# Patient Record
Sex: Female | Born: 1970 | Race: White | Hispanic: No | Smoking: Current every day smoker
Health system: Southern US, Community
[De-identification: ages and names within clinical notes are randomized; demographics above are authoritative.]

## PROBLEM LIST (undated history)

## (undated) DIAGNOSIS — E785 Hyperlipidemia, unspecified: Secondary | ICD-10-CM

## (undated) DIAGNOSIS — J439 Emphysema, unspecified: Secondary | ICD-10-CM

## (undated) DIAGNOSIS — K219 Gastro-esophageal reflux disease without esophagitis: Secondary | ICD-10-CM

## (undated) DIAGNOSIS — F32A Depression, unspecified: Secondary | ICD-10-CM

## (undated) DIAGNOSIS — F419 Anxiety disorder, unspecified: Secondary | ICD-10-CM

## (undated) HISTORY — PX: ABDOMINAL HYSTERECTOMY: SHX81

## (undated) HISTORY — DX: Depression, unspecified: F32.A

## (undated) HISTORY — DX: Emphysema, unspecified: J43.9

## (undated) HISTORY — PX: OTHER SURGICAL HISTORY: SHX169

## (undated) HISTORY — DX: Hyperlipidemia, unspecified: E78.5

## (undated) HISTORY — DX: Gastro-esophageal reflux disease without esophagitis: K21.9

## (undated) HISTORY — PX: VAGINAL HYSTERECTOMY: SUR661

---

## 2003-03-19 ENCOUNTER — Ambulatory Visit (HOSPITAL_COMMUNITY): Admission: RE | Admit: 2003-03-19 | Discharge: 2003-03-19 | Payer: Self-pay | Admitting: Family Medicine

## 2003-03-19 ENCOUNTER — Encounter: Payer: Self-pay | Admitting: Family Medicine

## 2003-06-04 ENCOUNTER — Encounter: Payer: Self-pay | Admitting: Family Medicine

## 2003-06-04 ENCOUNTER — Ambulatory Visit (HOSPITAL_COMMUNITY): Admission: RE | Admit: 2003-06-04 | Discharge: 2003-06-04 | Payer: Self-pay | Admitting: Family Medicine

## 2006-12-12 ENCOUNTER — Encounter (INDEPENDENT_AMBULATORY_CARE_PROVIDER_SITE_OTHER): Payer: Self-pay | Admitting: *Deleted

## 2006-12-12 ENCOUNTER — Observation Stay (HOSPITAL_COMMUNITY): Admission: RE | Admit: 2006-12-12 | Discharge: 2006-12-13 | Payer: Self-pay | Admitting: Obstetrics & Gynecology

## 2008-09-29 ENCOUNTER — Emergency Department (HOSPITAL_COMMUNITY): Admission: EM | Admit: 2008-09-29 | Discharge: 2008-09-30 | Payer: Self-pay | Admitting: Emergency Medicine

## 2010-01-21 ENCOUNTER — Emergency Department (HOSPITAL_COMMUNITY): Admission: EM | Admit: 2010-01-21 | Discharge: 2010-01-21 | Payer: Self-pay | Admitting: Emergency Medicine

## 2010-01-31 ENCOUNTER — Emergency Department (HOSPITAL_COMMUNITY): Admission: EM | Admit: 2010-01-31 | Discharge: 2010-01-31 | Payer: Self-pay | Admitting: Emergency Medicine

## 2010-02-15 ENCOUNTER — Emergency Department (HOSPITAL_COMMUNITY): Admission: EM | Admit: 2010-02-15 | Discharge: 2010-02-15 | Payer: Self-pay | Admitting: Emergency Medicine

## 2010-03-22 ENCOUNTER — Emergency Department (HOSPITAL_COMMUNITY): Admission: EM | Admit: 2010-03-22 | Discharge: 2010-03-22 | Payer: Self-pay | Admitting: Emergency Medicine

## 2010-11-12 LAB — URINE MICROSCOPIC-ADD ON

## 2010-11-12 LAB — CBC
Platelets: 217 10*3/uL (ref 150–400)
RBC: 4.94 MIL/uL (ref 3.87–5.11)
WBC: 19.3 10*3/uL — ABNORMAL HIGH (ref 4.0–10.5)

## 2010-11-12 LAB — URINALYSIS, ROUTINE W REFLEX MICROSCOPIC
Glucose, UA: NEGATIVE mg/dL
Ketones, ur: NEGATIVE mg/dL
Leukocytes, UA: NEGATIVE
Nitrite: NEGATIVE
Protein, ur: 30 mg/dL — AB
Specific Gravity, Urine: >1.03 — ABNORMAL HIGH (ref 1.005–1.030)
Urobilinogen, UA: 0.2 mg/dL (ref 0.0–1.0)
pH: 5.5 (ref 5.0–8.0)

## 2010-11-12 LAB — URINE CULTURE
Colony Count: NO GROWTH
Culture: NO GROWTH

## 2010-11-12 LAB — BASIC METABOLIC PANEL
Calcium: 9.3 mg/dL (ref 8.4–10.5)
Creatinine, Ser: 1.01 mg/dL (ref 0.4–1.2)
GFR calc Af Amer: >60 mL/min (ref >60)
GFR calc non Af Amer: >60 mL/min (ref >60)

## 2010-11-12 LAB — DIFFERENTIAL
Basophils Absolute: 0 10*3/uL (ref 0.0–0.1)
Lymphocytes Relative: 11 % — ABNORMAL LOW (ref 12–46)
Lymphs Abs: 2.2 10*3/uL (ref 0.7–4.0)
Neutrophils Relative %: 85 % — ABNORMAL HIGH (ref 43–77)

## 2010-11-12 LAB — POCT PREGNANCY, URINE: Preg Test, Ur: NEGATIVE

## 2010-11-13 LAB — URINE MICROSCOPIC-ADD ON

## 2010-11-13 LAB — URINALYSIS, ROUTINE W REFLEX MICROSCOPIC
Ketones, ur: NEGATIVE mg/dL
Leukocytes, UA: NEGATIVE
Nitrite: NEGATIVE
Specific Gravity, Urine: >1.03 — ABNORMAL HIGH (ref 1.005–1.030)
Urobilinogen, UA: 1 mg/dL (ref 0.0–1.0)
pH: 6 (ref 5.0–8.0)

## 2010-11-13 LAB — CBC
HCT: 46.7 % — ABNORMAL HIGH (ref 36.0–46.0)
Hemoglobin: 15.7 g/dL — ABNORMAL HIGH (ref 12.0–15.0)
MCHC: 33.6 g/dL (ref 30.0–36.0)
MCV: 94.7 fL (ref 78.0–100.0)
Platelets: 214 10*3/uL (ref 150–400)
RDW: 13.3 % (ref 11.5–15.5)

## 2010-11-13 LAB — BASIC METABOLIC PANEL
BUN: 6 mg/dL (ref 6–23)
CO2: 24 mEq/L (ref 19–32)
GFR calc non Af Amer: >60 mL/min (ref >60)
Glucose, Bld: 98 mg/dL (ref 70–99)
Potassium: 4 mEq/L (ref 3.5–5.1)

## 2010-11-13 LAB — DIFFERENTIAL
Basophils Absolute: 0 10*3/uL (ref 0.0–0.1)
Basophils Relative: 0 % (ref 0–1)
Eosinophils Absolute: 0.2 10*3/uL (ref 0.0–0.7)
Eosinophils Relative: 2 % (ref 0–5)
Lymphocytes Relative: 29 % (ref 12–46)
Monocytes Absolute: 0.5 10*3/uL (ref 0.1–1.0)

## 2010-11-14 LAB — POCT PREGNANCY, URINE: Preg Test, Ur: NEGATIVE

## 2010-11-14 LAB — URINE MICROSCOPIC-ADD ON

## 2010-11-14 LAB — PREGNANCY, URINE: Preg Test, Ur: NEGATIVE

## 2010-11-14 LAB — URINALYSIS, ROUTINE W REFLEX MICROSCOPIC
Bilirubin Urine: NEGATIVE
Glucose, UA: NEGATIVE mg/dL
Ketones, ur: NEGATIVE mg/dL
Leukocytes, UA: NEGATIVE
Nitrite: NEGATIVE
Nitrite: NEGATIVE
Protein, ur: NEGATIVE mg/dL
Urobilinogen, UA: 1 mg/dL (ref 0.0–1.0)
pH: 6.5 (ref 5.0–8.0)

## 2010-12-06 ENCOUNTER — Emergency Department (HOSPITAL_COMMUNITY)
Admission: EM | Admit: 2010-12-06 | Discharge: 2010-12-06 | Disposition: A | Discharge status: Home / Self Care | Payer: Medicaid Other | Attending: Emergency Medicine | Admitting: Emergency Medicine

## 2010-12-06 ENCOUNTER — Emergency Department (HOSPITAL_COMMUNITY): Payer: Medicaid Other

## 2010-12-06 DIAGNOSIS — Y92009 Unspecified place in unspecified non-institutional (private) residence as the place of occurrence of the external cause: Secondary | ICD-10-CM | POA: Insufficient documentation

## 2010-12-06 DIAGNOSIS — Y998 Other external cause status: Secondary | ICD-10-CM | POA: Insufficient documentation

## 2010-12-06 DIAGNOSIS — F17200 Nicotine dependence, unspecified, uncomplicated: Secondary | ICD-10-CM | POA: Insufficient documentation

## 2010-12-06 DIAGNOSIS — S8000XA Contusion of unspecified knee, initial encounter: Secondary | ICD-10-CM | POA: Insufficient documentation

## 2010-12-06 DIAGNOSIS — W010XXA Fall on same level from slipping, tripping and stumbling without subsequent striking against object, initial encounter: Secondary | ICD-10-CM | POA: Insufficient documentation

## 2011-01-13 NOTE — Op Note (Signed)
Lacey Woodard, Lacey Woodard              ACCOUNT NO.:  [id number]   MEDICAL RECORD NO.:  [id number]          PATIENT TYPE:  INP   LOCATION:  A428                          FACILITY:  [facility]   PHYSICIAN:  Lazaro Arms, M.D.   DATE OF BIRTH:  Jan 11, 1971   DATE OF PROCEDURE:  12/12/2006  DATE OF DISCHARGE:                               OPERATIVE REPORT   PREOPERATIVE DIAGNOSES:  1. Menometrorrhagia.  2. Dysmenorrhea.  3. Dyspareunia.   POSTOPERATIVE DIAGNOSES:  1. Menometrorrhagia.  2. Dysmenorrhea.  3. Dyspareunia.  4. Suspected adenomyosis.   PROCEDURE:  Total vaginal hysterectomy, bilateral salpingo-oophorectomy.   SURGEON:  Lazaro Arms, M.D.   ANESTHESIA:  General endotracheal.   FINDINGS:  The patient had a soft, mushy uterus consistent with  adenomyosis.  The ovaries and tubes were otherwise normal.  She may have  had a small endometrioma on the right ovary but I was unsure.  It would  have been very small.  It could have just been old hemorrhagic corpus  luteum but they will evaluate it at pathology.   DESCRIPTION OF OPERATION:  The patient taken to the operating room,  placed in supine position where she underwent general endotracheal  anesthesia.  Placed in the dorsal lithotomy position.  Prepped and  draped in the usual sterile fashion.  Weighted speculum was placed.  Foley catheter was placed.  Cervix was grasped.  Circumferential  incision was made with the electrocautery unit.  The anterior and  posterior vagina pushed off the lower uterine segment.  The anterior cul-  de-sac was entered.  The uterosacral ligaments were clamped, cut and  suture ligated bilaterally.  Cardinal ligaments were clamped, cut and  suture ligated.  Anterior cul-de-sac was entered.  The anterior and  posterior leaves of the broad ligament were plicated.  Uterine vessels  were clamped, cut and suture ligated.  A pedicle was taken up the fundus  on each side, clamped, cut and suture ligated.   The utero-ovarian  ligaments were crossclamped and double suture ligated with good  hemostasis.  The ovaries were then grasp.  The infundibular pelvic  ligaments were clamped, cut and suture ligated bilaterally with good  hemostasis.  A;; the pedicles were found to be hemostatic.  The perineum  was closed with a pursestring suture and 3-0 Monocryl.  The  vagina was closed side-to-side interrupted using 0 Vicryl.  The patient  tolerated the procedure well.  She experienced 100 mL blood loss,  tolerated procedure well without any problems.  She was taken to  recovery room in good stable condition.  She received Ancef  prophylactically.      Lazaro Arms, M.D.  Electronically Signed     LHE/MEDQ  D:  12/12/2006  T:  12/12/2006  Job:  [id number]

## 2011-03-23 ENCOUNTER — Encounter: Payer: Self-pay | Admitting: *Deleted

## 2011-03-23 ENCOUNTER — Emergency Department (HOSPITAL_COMMUNITY)
Admission: EM | Admit: 2011-03-23 | Discharge: 2011-03-24 | Disposition: A | Discharge status: Home / Self Care | Payer: Medicaid Other | Attending: Emergency Medicine | Admitting: Emergency Medicine

## 2011-03-23 DIAGNOSIS — IMO0002 Reserved for concepts with insufficient information to code with codable children: Secondary | ICD-10-CM | POA: Insufficient documentation

## 2011-03-23 DIAGNOSIS — Y9229 Other specified public building as the place of occurrence of the external cause: Secondary | ICD-10-CM | POA: Insufficient documentation

## 2011-03-23 DIAGNOSIS — S29011A Strain of muscle and tendon of front wall of thorax, initial encounter: Secondary | ICD-10-CM

## 2011-03-23 DIAGNOSIS — X58XXXA Exposure to other specified factors, initial encounter: Secondary | ICD-10-CM | POA: Insufficient documentation

## 2011-03-23 NOTE — ED Notes (Signed)
Pt reports rt sided cp starting 2 hs ago with asso nausea, pt also reports left arm pain x 2 days worsening tonight at onset of cp

## 2011-03-24 ENCOUNTER — Emergency Department (HOSPITAL_COMMUNITY): Payer: Medicaid Other

## 2011-03-24 LAB — POCT I-STAT, CHEM 8
Calcium, Ion: 1.11 mmol/L — ABNORMAL LOW (ref 1.12–1.32)
Creatinine, Ser: 0.8 mg/dL (ref 0.50–1.10)
Glucose, Bld: 80 mg/dL (ref 70–99)
HCT: 41 % (ref 36.0–46.0)
Hemoglobin: 13.9 g/dL (ref 12.0–15.0)
Potassium: 5.1 mEq/L (ref 3.5–5.1)

## 2011-03-24 LAB — D-DIMER, QUANTITATIVE: D-Dimer, Quant: 0.22 ug{FEU}/mL (ref 0.00–0.48)

## 2011-03-24 LAB — CARDIAC PANEL(CRET KIN+CKTOT+MB+TROPI)
CK, MB: 1.8 ng/mL (ref 0.3–4.0)
Total CK: 55 U/L (ref 7–177)
Troponin I: <0.3 ng/mL (ref <0.30)

## 2011-03-24 MED ORDER — IBUPROFEN 800 MG PO TABS: 800.0000 mg | ORAL_TABLET | Freq: Three times a day (TID) | ORAL | Status: AC

## 2011-03-24 MED ORDER — HYDROCODONE-ACETAMINOPHEN 5-500 MG PO TABS: 1.0000 | ORAL_TABLET | ORAL | Status: AC | PRN

## 2011-03-24 MED ORDER — ONDANSETRON HCL 4 MG/2ML IJ SOLN
4.0000 mg | Freq: Once | INTRAMUSCULAR | Status: AC
  Administered 2011-03-24: 4 mg via INTRAVENOUS

## 2011-03-24 MED ORDER — KETOROLAC TROMETHAMINE 30 MG/ML IJ SOLN
30.0000 mg | Freq: Once | INTRAMUSCULAR | Status: AC
  Administered 2011-03-24: 30 mg via INTRAVENOUS
  Filled 2011-03-24: qty 1

## 2011-03-24 MED ORDER — MORPHINE SULFATE 2 MG/ML IJ SOLN
2.0000 mg | Freq: Once | INTRAMUSCULAR | Status: AC
  Administered 2011-03-24: 2 mg via INTRAVENOUS
  Filled 2011-03-24: qty 1

## 2011-03-24 MED ORDER — ONDANSETRON HCL 4 MG/2ML IJ SOLN
INTRAMUSCULAR | Status: AC
  Administered 2011-03-24: 4 mg via INTRAVENOUS
  Filled 2011-03-24: qty 2

## 2011-03-24 NOTE — ED Provider Notes (Signed)
History     Chief Complaint  Patient presents with  . 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 Left arm was aching and had numbness andtingling that extended to the hand. Developed chest pain across the entire chest today. No relief with aleve or advil. No associated shortness of breath, nausea or vomiting. Denies fever, chills, cough. Does not recall any injury or strenuous activity. Make worse with use.  Patient is a 40 y.o. female presenting with chest pain. The history is provided by the patient.  Chest Pain The chest pain began 12 - 24 hours ago. Chest pain occurs constantly. The chest pain is unchanged. At its most intense, the pain is at 6/10. The quality of the pain is described as aching, burning and tightness (numbness to left arm and hand). Chest pain is worsened by certain positions.  Associated symptoms include numbness. She tried NSAIDs for the symptoms.  Her family medical history is significant for CAD in family.     History reviewed. No pertinent past medical history.  Past Surgical History  Procedure Date  . Abdominal hysterectomy     No family history on file.  History  Substance Use Topics  . Smoking status: Current Everyday Smoker    Types: Cigarettes  . Smokeless tobacco: Not on file  . Alcohol Use: No    OB History    Grav Para Term Preterm Abortions TAB SAB Ect Mult Living                  Review of Systems  Cardiovascular: Positive for chest pain.  Neurological: Positive for numbness.  All other systems reviewed and are negative.    Physical Exam  BP 138/82  Pulse 77  Temp(Src) 97.6 F (36.4 C) (Oral)  SpO2 100%  Physical Exam  Constitutional: She is oriented to person, place, and time. She appears well-developed and well-nourished.  HENT:  Head: Normocephalic and atraumatic.  Eyes: EOM are normal. Pupils are equal, round, and reactive to light.  Neck: Normal  range of motion. Neck supple.  Cardiovascular: Normal rate and normal heart sounds.   Pulmonary/Chest: Effort normal and breath sounds normal.  Abdominal: Soft.  Musculoskeletal:       Tenderness with palpation of chest wall anteriorly. Axilla on left normal. No lymphadenopathy, lesions, erythema.  Neurological: She is alert and oriented to person, place, and time.       Normal sensation to light touch in the left arm and hand. Normal function. Cap refill brisk, pulses 2+ bilaterally  Skin: Skin is warm and dry.    ED Course  Procedures  MDM  Reviewed labs, radiology results. Reviewed results with patient. Patient with chest pain and left arm mumbness. Negative cardiac markers, normal EKG and negative d-dimer.    Date: 03/24/2011  23:24  Rate:70 Rhythm: normal sinus rhythm  QRS Axis: normal  Intervals: normal  ST/T Wave abnormalities: normal  Conduction Disutrbances:none  Narrative Interpretation:   Old EKG Reviewed: none available       Nicoletta Dress. Colon Branch, MD 03/24/11 [phone]

## 2011-11-01 ENCOUNTER — Encounter (HOSPITAL_COMMUNITY): Payer: Self-pay | Admitting: *Deleted

## 2011-11-01 ENCOUNTER — Emergency Department (HOSPITAL_COMMUNITY)
Admission: EM | Admit: 2011-11-01 | Discharge: 2011-11-01 | Disposition: A | Discharge status: Home / Self Care | Payer: Self-pay | Attending: Emergency Medicine | Admitting: Emergency Medicine

## 2011-11-01 ENCOUNTER — Other Ambulatory Visit: Payer: Self-pay

## 2011-11-01 ENCOUNTER — Emergency Department (HOSPITAL_COMMUNITY): Payer: Self-pay

## 2011-11-01 DIAGNOSIS — F17200 Nicotine dependence, unspecified, uncomplicated: Secondary | ICD-10-CM | POA: Insufficient documentation

## 2011-11-01 DIAGNOSIS — M79609 Pain in unspecified limb: Secondary | ICD-10-CM | POA: Insufficient documentation

## 2011-11-01 DIAGNOSIS — R110 Nausea: Secondary | ICD-10-CM | POA: Insufficient documentation

## 2011-11-01 DIAGNOSIS — R51 Headache: Secondary | ICD-10-CM | POA: Insufficient documentation

## 2011-11-01 DIAGNOSIS — R079 Chest pain, unspecified: Secondary | ICD-10-CM | POA: Insufficient documentation

## 2011-11-01 LAB — BASIC METABOLIC PANEL
BUN: 10 mg/dL (ref 6–23)
Calcium: 9.7 mg/dL (ref 8.4–10.5)
Chloride: 105 mEq/L (ref 96–112)
Creatinine, Ser: 1.11 mg/dL — ABNORMAL HIGH (ref 0.50–1.10)
GFR calc Af Amer: 71 mL/min — ABNORMAL LOW (ref >90)

## 2011-11-01 MED ORDER — HYDROMORPHONE HCL PF 1 MG/ML IJ SOLN
1.0000 mg | Freq: Once | INTRAMUSCULAR | Status: AC
  Administered 2011-11-01: 1 mg via INTRAVENOUS
  Filled 2011-11-01: qty 1

## 2011-11-01 MED ORDER — SODIUM CHLORIDE 0.9 % IV SOLN: INTRAVENOUS | Status: DC

## 2011-11-01 MED ORDER — ASPIRIN 325 MG PO TABS: 325.0000 mg | ORAL_TABLET | Freq: Once | ORAL | Status: DC

## 2011-11-01 MED ORDER — ASPIRIN 325 MG PO TABS
325.0000 mg | ORAL_TABLET | Freq: Once | ORAL | Status: AC
  Administered 2011-11-01: 325 mg via ORAL
  Filled 2011-11-01: qty 1

## 2011-11-01 MED ORDER — ONDANSETRON HCL 4 MG/2ML IJ SOLN
4.0000 mg | Freq: Once | INTRAMUSCULAR | Status: AC
  Administered 2011-11-01: 4 mg via INTRAVENOUS
  Filled 2011-11-01: qty 2

## 2011-11-01 MED ORDER — SODIUM CHLORIDE 0.9 % IV BOLUS (SEPSIS)
250.0000 mL | Freq: Once | INTRAVENOUS | Status: AC
  Administered 2011-11-01: 250 mL via INTRAVENOUS

## 2011-11-01 MED ORDER — HYDROCODONE-ACETAMINOPHEN 5-325 MG PO TABS: 1.0000 | ORAL_TABLET | Freq: Four times a day (QID) | ORAL | Status: AC | PRN

## 2011-11-01 MED ORDER — ASPIRIN 81 MG PO CHEW: 81.0000 mg | CHEWABLE_TABLET | Freq: Every day | ORAL | Status: AC

## 2011-11-01 NOTE — ED Notes (Signed)
Pt states CP began last night. Described as "feels like something sitting on my chest" with intermittent sharp pain. Pt states pain across upper chest and tingling to both arms and hands. Also states SOB. NAD at this time.

## 2011-11-01 NOTE — ED Provider Notes (Signed)
History   This chart was scribed for Lacey Jakes, MD by Clarita Crane. The patient was seen in room [facility]19/[facility]. Patient's care was started at 1137.    CSN: [id number]  Arrival date & time 11/01/11  1137   First MD Initiated Contact with Patient 11/01/11 1207      Chief Complaint  Patient presents with  . Chest Pain    (Consider location/radiation/quality/duration/timing/severity/associated sxs/prior treatment) HPI Lacey Woodard Still is a 41 y.o. female who presents to the Emergency Department complaining of both right sided and left sided chest pain radiating to bilateral axillary regions onset 15 hours ago and persistent since with associated nausea. Also reports experiencing onset of HA just prior to arrival in ED. Describes chest pain as heaviness. States chest pain is aggravated with deep breathing. Denies vomiting, swelling of lower extremities, abdominal pain, back pain, diarrhea, congestion, cough, dysuria, rash and history of similar symptoms. Denies h/o heart problems.  PCP- Sudie Bailey  History reviewed. No pertinent past medical history.  Past Surgical History  Procedure Date  . Abdominal hysterectomy     No family history on file.  History  Substance Use Topics  . Smoking status: Current Everyday Smoker    Types: Cigarettes  . Smokeless tobacco: Not on file  . Alcohol Use: No    OB History    Grav Para Term Preterm Abortions TAB SAB Ect Mult Living                  Review of Systems  Constitutional: Negative for fever.  HENT: Negative for rhinorrhea.   Eyes: Negative for pain.  Respiratory: Negative for cough.   Cardiovascular: Positive for chest pain.  Gastrointestinal: Positive for nausea. Negative for vomiting, abdominal pain and diarrhea.  Genitourinary: Negative for dysuria.  Musculoskeletal: Negative for back pain.  Skin: Negative for rash.  Neurological: Positive for headaches. Negative for weakness.    Allergies  Review of patient's  allergies indicates no known allergies.  Home Medications   Current Outpatient Rx  Name Route Sig Dispense Refill  . ASPIRIN 81 MG PO CHEW Oral Chew 1 tablet (81 mg total) by mouth daily. 30 tablet 1  . HYDROCODONE-ACETAMINOPHEN 5-325 MG PO TABS Oral Take 1-2 tablets by mouth every 6 (six) hours as needed for pain. 10 tablet 0    BP 115/70  Pulse 66  Temp(Src) 97.8 F (36.6 C) (Oral)  Resp 18  Ht 5\' 7"  (1.702 m)  Wt 135 lb (61.236 kg)  BMI 21.14 kg/m2  SpO2 97%  Physical Exam  Nursing note and vitals reviewed. Constitutional: She is oriented to person, place, and time. She appears well-developed and well-nourished. No distress.  HENT:  Head: Normocephalic and atraumatic.  Eyes: EOM are normal. Pupils are equal, round, and reactive to light.  Neck: Neck supple. No tracheal deviation present.  Cardiovascular: Normal rate and regular rhythm.  Exam reveals no gallop and no friction rub.   No murmur heard. Pulmonary/Chest: Effort normal. No respiratory distress. She has no wheezes. She has no rales.  Abdominal: Soft. Bowel sounds are normal. She exhibits no distension. There is no tenderness.  Musculoskeletal: Normal range of motion. She exhibits no edema.  Lymphadenopathy:    She has no cervical adenopathy.  Neurological: She is alert and oriented to person, place, and time. No cranial nerve deficit or sensory deficit.  Skin: Skin is warm and dry.  Psychiatric: She has a normal mood and affect. Her behavior is normal.    ED  Course  Procedures (including critical care time)  DIAGNOSTIC STUDIES: Oxygen Saturation is 98% on room air, normal by my interpretation.    COORDINATION OF CARE: 1:10PM- Patient informed of current plan for treatment and evaluation and agrees with plan at this time.    Date: 11/01/2011  Rate: 86  Rhythm: normal sinus rhythm and sinus arrhythmia  QRS Axis: normal  Intervals: normal  ST/T Wave abnormalities: normal  Conduction Disutrbances:none   Narrative Interpretation:   Old EKG Reviewed: none available    Labs Reviewed  BASIC METABOLIC PANEL - Abnormal; Notable for the following:    Creatinine, Ser 1.11 (*)    GFR calc non Af Amer 61 (*)    GFR calc Af Amer 71 (*)    All other components within normal limits  TROPONIN I  D-DIMER, QUANTITATIVE   Dg Chest 2 View  11/01/2011  *RADIOLOGY REPORT*  Clinical Data: Chest pain.  CHEST - 2 VIEW  Comparison: 03/24/2011  Findings: Air bronchograms are noted posteriorly on the lateral view.  These are not well visualized on the frontal view, possibly in the left base.  Heart is normal size.  No effusions.  No acute bony abnormality.  IMPRESSION: Air bronchograms posteriorly on the lateral view in the lung bases, atelectasis versus pneumonia.  Recommend clinical correlation.  Original Report Authenticated By: Cyndie Chime, M.D.   Results for orders placed during the hospital encounter of 11/01/11  BASIC METABOLIC PANEL      Component Value Range   Sodium 139  135 - 145 (mEq/L)   Potassium 4.1  3.5 - 5.1 (mEq/L)   Chloride 105  96 - 112 (mEq/L)   CO2 24  19 - 32 (mEq/L)   Glucose, Bld 90  70 - 99 (mg/dL)   BUN 10  6 - 23 (mg/dL)   Creatinine, Ser 1.61 (*) 0.50 - 1.10 (mg/dL)   Calcium 9.7  8.4 - 09.6 (mg/dL)   GFR calc non Af Amer 61 (*) >90 (mL/min)   GFR calc Af Amer 71 (*) >90 (mL/min)  TROPONIN I      Component Value Range   Troponin I <0.30  <0.30 (ng/mL)  D-DIMER, QUANTITATIVE      Component Value Range   D-Dimer, Quant 0.26  0.00 - 0.48 (ug/mL-FEU)     1. Chest pain       MDM   Patient with onset of chest pain last night at 10:00 it has been constant associated with shortness of breath. Workup in the emergency department d-dimer is negative not likely consistent with a pulmonary embolism. Chest x-ray negative for pneumonia or pneumothorax. EKG without acute changes. Cardiac marker troponin negative not consistent with acute cardiac event. Patient's pain improved  with IV pain medicines.  Patient has followup locally with her primary care provider will start her on a baby aspirin a day and hydrocodone as needed for more severe pain.     I personally performed the services described in this documentation, which was scribed in my presence. The recorded information has been reviewed and considered.     Lacey Jakes, MD 11/01/11 [phone]

## 2011-11-01 NOTE — Discharge Instructions (Signed)
Aspirin and Your Heart Aspirin affects the way your blood clots and helps "thin" the blood. Aspirin has many uses in heart disease. It may be used as a primary prevention to help reduce the risk of heart related events. It also can be used as a secondary measure to prevent more heart attacks or to prevent additional damage from blood clots.  ASPIRIN MAY HELP IF YOU:  Have had a heart attack or chest pain.   Have undergone open heart surgery such as CABG (Coronary Artery Bypass Surgery).   Have had coronary angioplasty with or without stents.   Have experienced a stroke or TIA (transient ischemic attack).   Have peripheral vascular disease (PAD).   Have chronic heart rhythm problems such as atrial fibrillation.   Are at risk for heart disease.  BEFORE STARTING ASPIRIN Before you start taking aspirin, your caregiver will need to review your medical history. Many things will need to be taken into consideration, such as:  Smoking status.   Blood pressure.   Diabetes.   Gender.   Weight.   Cholesterol level.  ASPIRIN DOSES  Aspirin should only be taken on the advice of your caregiver. Talk to your caregiver about how much aspirin you should take. Aspirin comes in different doses such as:   81 mg.   162 mg.   325 mg.   The aspirin dose you take may be affected by many factors, some of which include:   Your current medications, especially if your are taking blood-thinners or anti-platelet medicine.   Liver function.   Heart disease risk.   Age.   Aspirin comes in two forms:   Non-enteric-coated. This type of aspirin does not have a coating and is absorbed faster. Non-enteric coated aspirin is recommended for patients experiencing chest pain symptoms. This type of aspirin also comes in a chewable form.   Enteric-coated. This means the aspirin has a special coating that releases the medicine very slowly. Enteric-coated aspirin causes less stomach upset. This type of  aspirin should not be chewed or crushed.  ASPIRIN SIDE EFFECTS Daily use of aspirin can increase your risk of serious side effects, some of these include:  Increased bleeding. This can range from a cut that does not stop bleeding to more serious problems such as stomach bleeding or bleeding into the brain (Intracerebral bleeding).   Increased bruising.   Stomach upset.   An allergic reaction such as red, itchy skin.   Increased risk of bleeding when combined with non-steroidal anti-inflammatory medicine (NSAIDS).   Alcohol should be drank in moderation when taking aspirin. Alcohol can increase the risk of stomach bleeding when taken with aspirin.   Aspirin should not be given to children less than 18 years of age due to the association of Reye syndrome. Reye syndrome is a serious illness that can affect the brain and liver. Studies have linked Reye syndrome with aspirin use in children.   People that have nasal polyps have an increased risk of developing an aspirin allergy.  SEEK MEDICAL CARE IF:   You develop an allergic reaction such as:   Hives.   Itchy skin.   Swelling of the lips, tongue or face.   You develop stomach pain.   You have unusual bleeding or bruising.   You have ringing in your ears.  SEEK IMMEDIATE MEDICAL CARE IF:   You have severe chest pain, especially if the pain is crushing or pressure-like and spreads to the arms, back, neck, or jaw. THIS   IS AN EMERGENCY. Do not wait to see if the pain will go away. Get medical help at once. Call your local emergency services (911 in the [place] DO NOT drive yourself to the hospital.   You have stroke-like symptoms such as:   Loss of vision.   Difficulty talking.   Numbness or weakness on one side of your body.   Numbness or weakness in your arm or leg.   Not thinking clearly or feeling confused.   Your bowel movements are bloody, dark red or black in color.   You vomit or cough up blood.   You have blood  in your urine.   You have shortness of breath, coughing or wheezing.  MAKE SURE YOU:   Understand these instructions.   Will monitor your condition.   Seek immediate medical care if necessary.  Document Released: 07/27/2008 Document Revised: 08/03/2011 Document Reviewed: 07/27/2008 [employer] Patient Information 2012 [place], [place].  followup with Dr. Sudie Bailey your regular doctor in the next few days. Today's workup was negative based on EKG cardiac markers chest x-ray and blood clot marker. Return for new or worse symptoms. Recommend start taking a baby aspirin once a day. Stronger pain medicine provided to take as needed.

## 2013-08-17 ENCOUNTER — Emergency Department (HOSPITAL_COMMUNITY): Payer: Self-pay

## 2013-08-17 ENCOUNTER — Emergency Department (HOSPITAL_COMMUNITY)
Admission: EM | Admit: 2013-08-17 | Discharge: 2013-08-18 | Discharge status: Against Medical Advice | Payer: Self-pay | Attending: Emergency Medicine | Admitting: Emergency Medicine

## 2013-08-17 ENCOUNTER — Encounter (HOSPITAL_COMMUNITY): Payer: Self-pay | Admitting: Emergency Medicine

## 2013-08-17 DIAGNOSIS — R209 Unspecified disturbances of skin sensation: Secondary | ICD-10-CM | POA: Insufficient documentation

## 2013-08-17 DIAGNOSIS — H53149 Visual discomfort, unspecified: Secondary | ICD-10-CM | POA: Insufficient documentation

## 2013-08-17 DIAGNOSIS — R110 Nausea: Secondary | ICD-10-CM | POA: Insufficient documentation

## 2013-08-17 DIAGNOSIS — R29818 Other symptoms and signs involving the nervous system: Secondary | ICD-10-CM | POA: Insufficient documentation

## 2013-08-17 DIAGNOSIS — R200 Anesthesia of skin: Secondary | ICD-10-CM

## 2013-08-17 DIAGNOSIS — F17200 Nicotine dependence, unspecified, uncomplicated: Secondary | ICD-10-CM | POA: Insufficient documentation

## 2013-08-17 DIAGNOSIS — R51 Headache: Secondary | ICD-10-CM | POA: Insufficient documentation

## 2013-08-17 LAB — CBC WITH DIFFERENTIAL/PLATELET
Basophils Relative: 0 % (ref 0–1)
Eosinophils Absolute: 0.1 10*3/uL (ref 0.0–0.7)
Eosinophils Relative: 1 % (ref 0–5)
Hemoglobin: 14 g/dL (ref 12.0–15.0)
Lymphs Abs: 4.3 10*3/uL — ABNORMAL HIGH (ref 0.7–4.0)
MCH: 31.7 pg (ref 26.0–34.0)
MCHC: 33.9 g/dL (ref 30.0–36.0)
MCV: 93.7 fL (ref 78.0–100.0)
Monocytes Relative: 6 % (ref 3–12)
Platelets: 250 10*3/uL (ref 150–400)
RBC: 4.41 MIL/uL (ref 3.87–5.11)

## 2013-08-17 LAB — COMPREHENSIVE METABOLIC PANEL
Alkaline Phosphatase: 101 U/L (ref 39–117)
BUN: 11 mg/dL (ref 6–23)
Chloride: 104 mEq/L (ref 96–112)
GFR calc Af Amer: >90 mL/min (ref >90)
GFR calc non Af Amer: >90 mL/min (ref >90)
Glucose, Bld: 92 mg/dL (ref 70–99)
Potassium: 3.7 mEq/L (ref 3.5–5.1)
Total Bilirubin: 0.2 mg/dL — ABNORMAL LOW (ref 0.3–1.2)

## 2013-08-17 MED ORDER — DIPHENHYDRAMINE HCL 25 MG PO CAPS
25.0000 mg | ORAL_CAPSULE | Freq: Once | ORAL | Status: AC
  Administered 2013-08-17: 25 mg via ORAL
  Filled 2013-08-17: qty 1

## 2013-08-17 MED ORDER — LORAZEPAM 2 MG/ML IJ SOLN
1.0000 mg | Freq: Once | INTRAMUSCULAR | Status: DC
  Filled 2013-08-17: qty 1

## 2013-08-17 MED ORDER — PROCHLORPERAZINE EDISYLATE 5 MG/ML IJ SOLN
10.0000 mg | Freq: Once | INTRAMUSCULAR | Status: AC
  Administered 2013-08-17: 10 mg via INTRAVENOUS
  Filled 2013-08-17: qty 2

## 2013-08-17 NOTE — ED Notes (Addendum)
Pt c/o upper back/neck pain that radiates down left arm and also having left leg pain. Pt also c/o some nausea.

## 2013-08-18 NOTE — ED Notes (Signed)
Patient wanted to leave.  States that she feels better and does not want to stay.  States that we have been helpful, she just cannot stay.  Explained to patient about leaving against medical advice. Patient verbalized understanding.  Signed AMA Form and advised patient to return if symptoms get worse

## 2013-08-18 NOTE — ED Provider Notes (Signed)
CSN: [id number]     Arrival date & time 08/17/13  2100 History   First MD Initiated Contact with Patient 08/17/13 2212     Chief Complaint  Patient presents with  . Generalized Body Aches   (Consider location/radiation/quality/duration/timing/severity/associated sxs/prior Treatment) HPI Comments: Lacey Woodard is a 42 y.o. Female with a history of occasional migraine headaches presents with pain and tightness across her bilateral upper shoulders,  Moderate left sided headache and photophobia and nausea without emesis or abdominal pain along with left upper and lower extremity numbness without weakness.  Her symptoms started slowly as she was driving home from early Christmas celebration with her mother which is a 1.5 hour drive away.  She does report increased stress as the anniversary of her fathers death is this week and her uncle just died last night.  She reports her headache is similar to prior headaches, except she has never experienced numbness with migraine.  She denies fevers, chills, neck stiffness, dizziness, visual changes (other than photophobia), no dysarthria or focal weakness.  She has had no medicines prior to arrival and has found no alleviators.     The history is provided by the patient.    History reviewed. No pertinent past medical history. Past Surgical History  Procedure Laterality Date  . Abdominal hysterectomy     History reviewed. No pertinent family history. History  Substance Use Topics  . Smoking status: Current Every Day Smoker    Types: Cigarettes  . Smokeless tobacco: Not on file  . Alcohol Use: No   OB History   Grav Para Term Preterm Abortions TAB SAB Ect Mult Living                 Review of Systems  Constitutional: Negative for fever and chills.  HENT: Negative for congestion and sore throat.   Eyes: Positive for photophobia. Negative for visual disturbance.  Respiratory: Negative for chest tightness and shortness of breath.    Cardiovascular: Negative for chest pain.  Gastrointestinal: Positive for nausea. Negative for vomiting and abdominal pain.  Genitourinary: Negative.   Musculoskeletal: Negative for arthralgias, joint swelling and neck pain.  Skin: Negative.  Negative for rash.  Neurological: Positive for numbness. Negative for dizziness, weakness, light-headedness and headaches.  Psychiatric/Behavioral: Negative.     Allergies  Review of patient's allergies indicates no known allergies.  Home Medications  No current outpatient prescriptions on file. BP 163/82  Pulse 91  Temp(Src) 97.6 F (36.4 C) (Oral)  Resp 20  Ht 5\' 7"  (1.702 m)  Wt 135 lb (61.236 kg)  BMI 21.14 kg/m2  SpO2 100% Physical Exam  Nursing note and vitals reviewed. Constitutional: She is oriented to person, place, and time. She appears well-developed and well-nourished.  Uncomfortable appearing  HENT:  Head: Normocephalic and atraumatic.  Mouth/Throat: Oropharynx is clear and moist.  Eyes: EOM are normal. Pupils are equal, round, and reactive to light.  Neck: Normal range of motion. Neck supple.  Cardiovascular: Normal rate and normal heart sounds.   Pulmonary/Chest: Effort normal.  Abdominal: Soft. There is no tenderness.  Musculoskeletal: Normal range of motion.  Lymphadenopathy:    She has no cervical adenopathy.  Neurological: She is alert and oriented to person, place, and time. She has normal strength. A sensory deficit is present. Coordination and gait normal. GCS eye subscore is 4. GCS verbal subscore is 5. GCS motor subscore is 6.  Reflex Scores:      Bicep reflexes are 2+ on the right side  and 2+ on the left side. Normal heel-shin, normal rapid alternating movements. Cranial nerves III-XII intact.  No pronator drift. Decreased sensation to fine touch left extremities compared to right.  Equal grip strength.  Negative babinski.  No lower extremity strength deficit.  Skin: Skin is warm and dry. No rash noted.   Psychiatric: She has a normal mood and affect. Her speech is normal and behavior is normal. Thought content normal. Cognition and memory are normal.    ED Course  Procedures (including critical care time) Labs Review Labs Reviewed  CBC WITH DIFFERENTIAL - Abnormal; Notable for the following:    Lymphs Abs 4.3 (*)    All other components within normal limits  COMPREHENSIVE METABOLIC PANEL - Abnormal; Notable for the following:    Total Bilirubin 0.2 (*)    All other components within normal limits  PROTIME-INR  APTT  URINALYSIS, ROUTINE W REFLEX MICROSCOPIC   Imaging Review No results found.  EKG Interpretation   None       MDM   1. Headache   2. Numbness and tingling in left arm   3. Numbness and tingling of left leg    Discussed possibility of sx being from atypical migraine,  No weakness or focal deficit appreciated, doubt vascular event.  However,  Pt has never been formally diagnosed with migraines.  Advised labs, CT head to rule out possible other source of sx.  Pt agreeable at first.  IV started,  Given compazine and benadryl which caused brief increased anxiety "feels like I am crawling out of my skin".  Ordered ativan, but sx resolved prior to giving this med.  Pt then decided she felt better and did not want any further tests due to lack of insurance.  Advised pt that I feel CT head is important given sx.  Pt refused.  She decided to leave AMA.  Advised pt to return for further eval if sx worsen or she changes her mind about work up.  She was ambulatory, without complaint,  Stating she felt much better as she was leaving.     Date: 08/17/2013  Rate: 81  Rhythm: normal sinus rhythm  QRS Axis: normal  Intervals: normal  ST/T Wave abnormalities: normal  Conduction Disutrbances:none  Narrative Interpretation:   Old EKG Reviewed: unchanged    Burgess Amor, PA-C 08/18/13 1333

## 2013-08-25 NOTE — ED Provider Notes (Signed)
Medical screening examination/treatment/procedure(s) were performed by non-physician practitioner and as supervising physician I was immediately available for consultation/collaboration.  EKG Interpretation    Date/Time:  Sunday August 17 2013 23:01:08 EST Ventricular Rate:  81 PR Interval:  158 QRS Duration: 76 QT Interval:  368 QTC Calculation: 427 R Axis:   30 Text Interpretation:  Normal sinus rhythm Normal ECG ED PHYSICIAN INTERPRETATION AVAILABLE IN [facility] Confirmed by TEST, RECORD ([id number]) on 08/20/2013 10:21:05 AM              Shelda Jakes, MD 08/25/13 1022

## 2015-12-10 ENCOUNTER — Emergency Department (HOSPITAL_COMMUNITY): Payer: BLUE CROSS/BLUE SHIELD

## 2015-12-10 ENCOUNTER — Emergency Department (HOSPITAL_COMMUNITY)
Admission: EM | Admit: 2015-12-10 | Discharge: 2015-12-10 | Disposition: A | Discharge status: Home / Self Care | Payer: BLUE CROSS/BLUE SHIELD | Attending: Emergency Medicine | Admitting: Emergency Medicine

## 2015-12-10 ENCOUNTER — Encounter (HOSPITAL_COMMUNITY): Payer: Self-pay | Admitting: Emergency Medicine

## 2015-12-10 DIAGNOSIS — S63502A Unspecified sprain of left wrist, initial encounter: Secondary | ICD-10-CM | POA: Insufficient documentation

## 2015-12-10 DIAGNOSIS — Y9302 Activity, running: Secondary | ICD-10-CM | POA: Insufficient documentation

## 2015-12-10 DIAGNOSIS — W1839XA Other fall on same level, initial encounter: Secondary | ICD-10-CM | POA: Insufficient documentation

## 2015-12-10 DIAGNOSIS — S4992XA Unspecified injury of left shoulder and upper arm, initial encounter: Secondary | ICD-10-CM | POA: Diagnosis present

## 2015-12-10 DIAGNOSIS — S5012XA Contusion of left forearm, initial encounter: Secondary | ICD-10-CM | POA: Diagnosis not present

## 2015-12-10 DIAGNOSIS — Y9259 Other trade areas as the place of occurrence of the external cause: Secondary | ICD-10-CM | POA: Diagnosis not present

## 2015-12-10 DIAGNOSIS — Z79899 Other long term (current) drug therapy: Secondary | ICD-10-CM | POA: Diagnosis not present

## 2015-12-10 DIAGNOSIS — Y999 Unspecified external cause status: Secondary | ICD-10-CM | POA: Diagnosis not present

## 2015-12-10 DIAGNOSIS — F17210 Nicotine dependence, cigarettes, uncomplicated: Secondary | ICD-10-CM | POA: Insufficient documentation

## 2015-12-10 MED ORDER — KETOROLAC TROMETHAMINE 10 MG PO TABS
10.0000 mg | ORAL_TABLET | Freq: Once | ORAL | Status: AC
  Administered 2015-12-10: 10 mg via ORAL
  Filled 2015-12-10: qty 1

## 2015-12-10 MED ORDER — HYDROCODONE-ACETAMINOPHEN 5-325 MG PO TABS: 1.0000 | ORAL_TABLET | ORAL | Status: DC | PRN

## 2015-12-10 MED ORDER — DICLOFENAC SODIUM 75 MG PO TBEC: 75.0000 mg | DELAYED_RELEASE_TABLET | Freq: Two times a day (BID) | ORAL | Status: DC

## 2015-12-10 NOTE — ED Provider Notes (Signed)
CSN: [id number]     Arrival date & time 12/10/15  1455 History   First MD Initiated Contact with Patient 12/10/15 1532     Chief Complaint  Patient presents with  . Arm Injury     (Consider location/radiation/quality/duration/timing/severity/associated sxs/prior Treatment) HPI Comments: Patient is a 45 year old female who presents to the emergency department following a fall, an injury to the left arm.  The patient states that she was running into an office that have been mopped. She was unaware of a wet floor, she fell on an outstretched left arm. She has pain of the forearm extending into the elbow, she also has pain of the left hand. She denies hitting her head, she denies any loss of consciousness. The patient also denies any injury to the chest, back, or hips. Patient denies being on any anticoagulation medications. She's not had any previous operations involving the left arm.  Patient is a 45 y.o. female presenting with arm injury. The history is provided by the patient.  Arm Injury Associated symptoms: no back pain and no neck pain     History reviewed. No pertinent past medical history. Past Surgical History  Procedure Laterality Date  . Abdominal hysterectomy     No family history on file. Social History  Substance Use Topics  . Smoking status: Current Every Day Smoker -- 1.00 packs/day    Types: Cigarettes  . Smokeless tobacco: None  . Alcohol Use: No   OB History    No data available     Review of Systems  Constitutional: Negative for activity change.       All ROS Neg except as noted in HPI  HENT: Negative for nosebleeds.   Eyes: Negative for photophobia and discharge.  Respiratory: Negative for cough, shortness of breath and wheezing.   Cardiovascular: Negative for chest pain and palpitations.  Gastrointestinal: Negative for abdominal pain and blood in stool.  Genitourinary: Negative for dysuria, frequency and hematuria.  Musculoskeletal: Negative for back  pain, arthralgias and neck pain.  Skin: Negative.   Neurological: Negative for dizziness, seizures and speech difficulty.  Psychiatric/Behavioral: Negative for hallucinations and confusion.      Allergies  Review of patient's allergies indicates no known allergies.  Home Medications   Prior to Admission medications   Medication Sig Start Date End Date Taking? Authorizing Provider  ALPRAZolam Prudy Feeler) 0.5 MG tablet Take 1 tablet by mouth at bedtime as needed for sleep.  10/25/15  Yes Historical Provider, MD  loratadine (CLARITIN) 10 MG tablet Take 10 mg by mouth daily as needed for allergies.   Yes Historical Provider, MD  venlafaxine XR (EFFEXOR-XR) 150 MG 24 hr capsule Take 1 capsule by mouth at bedtime. 09/27/15  Yes Historical Provider, MD   BP 134/79 mmHg  Pulse 90  Temp(Src) 98.2 F (36.8 C) (Oral)  Resp 14  Ht  (1.702 m)  Wt 63.504 kg  BMI 21.92 kg/m2  SpO2 98% Physical Exam  Constitutional: She is oriented to person, place, and time. She appears well-developed and well-nourished.  Non-toxic appearance.  HENT:  Head: Normocephalic.  Right Ear: Tympanic membrane and external ear normal.  Left Ear: Tympanic membrane and external ear normal.  Eyes: EOM and lids are normal. Pupils are equal, round, and reactive to light.  Neck: Normal range of motion. Neck supple. Carotid bruit is not present.  Cardiovascular: Normal rate, regular rhythm, normal heart sounds, intact distal pulses and normal pulses.   Pulmonary/Chest: Breath sounds normal. No respiratory distress.  Abdominal: Soft. Bowel sounds are normal. There is no tenderness. There is no guarding.  Musculoskeletal: Normal range of motion.       Left wrist: She exhibits tenderness. She exhibits no deformity.       Left forearm: She exhibits tenderness. She exhibits no deformity.       Left hand: She exhibits tenderness. She exhibits normal capillary refill and no deformity. Normal sensation noted. Normal strength  noted.       Hands: Lymphadenopathy:       Head (right side): No submandibular adenopathy present.       Head (left side): No submandibular adenopathy present.    She has no cervical adenopathy.  Neurological: She is alert and oriented to person, place, and time. She has normal strength. No cranial nerve deficit or sensory deficit.  Skin: Skin is warm and dry.  Psychiatric: She has a normal mood and affect. Her speech is normal.  Nursing note and vitals reviewed.   ED Course  Procedures (including critical care time) Labs Review Labs Reviewed - No data to display  Imaging Review Dg Forearm Left  12/10/2015  CLINICAL DATA:  Status post fall at work today with persistent left forearm and hand pain radiating into the thumb. EXAM: LEFT FOREARM - 2 VIEW COMPARISON:  None in PACs FINDINGS: Frontal and lateral views of the left radius and ulna reveal the bones to be adequately mineralized. There is no acute fracture nor dislocation. Specific attention to the radial head reveals no acute abnormality. The radiocarpal and ulnocarpal joints are normal. The carpal bones appear intact as do the metacarpal bases. The soft tissues of the forearm are unremarkable. IMPRESSION: There is no acute bony abnormality of the left radius or ulna. Electronically Signed   By: David  SwazilandJordan M.D.   On: 12/10/2015 15:17   Dg Hand Complete Left  12/10/2015  CLINICAL DATA:  Status post fall at work today with persistent 4 arm and hand pain radiating into the thumb EXAM: LEFT HAND - COMPLETE 3+ VIEW COMPARISON:  None in PACs FINDINGS: The bones of the left hand are adequately mineralized. The joint spaces are preserved. There is no acute fracture nor dislocation. There is no lytic nor blastic lesion. The soft tissues are normal. IMPRESSION: There is no acute fracture nor dislocation of the bones of the left hand. Electronically Signed   By: David  [place]Jordan M.D.   On: 12/10/2015 15:15   I have personally reviewed and evaluated  these images and lab results as part of my medical decision-making.   EKG Interpretation None      MDM  There is good range of motion of the left shoulder and elbow. There is pain to palpation of the left forearm, wrist, and palmar surface of the hand. The x-ray of the hand is negative for fracture or dislocation. The x-ray of the forearm is negative for fracture or dislocation.  Suspect wrist sprain, and contusion of the forearm area. The patient will be fitted with a wrist forearm splint, sling, and provided an ice pack. A prescription for diclofenac is given.    Final diagnoses:  None    **I have reviewed nursing notes, vital signs, and all appropriate lab and imaging results for this patient.Ivery Quale*    Shafer Swamy, PA-C 12/10/15 1613  Azalia BilisKevin Campos, MD 12/11/15 [phone]

## 2015-12-10 NOTE — ED Notes (Signed)
Pt reports falling on LT arm this morning. States pain radiates from wrist into hand and forearm. No deformity noted.

## 2015-12-10 NOTE — Discharge Instructions (Signed)
The x-rays of your hand and forearm were negative for fracture or dislocation. Your examination favors strain/sprain and contusion. Please apply the ice pack. Please use the diclofenac with food. Please see Dr. Ophelia CharterYates for additional evaluation and management if not improving. Wrist Sprain A wrist sprain is a stretch or tear in the strong, fibrous tissues (ligaments) that connect your wrist bones. The ligaments of your wrist may be easily sprained. There are three types of wrist sprains.  Grade 1. The ligament is not stretched or torn, but the sprain causes pain.  Grade 2. The ligament is stretched or partially torn. You may be able to move your wrist, but not very much.  Grade 3. The ligament or muscle completely tears. You may find it difficult or extremely painful to move your wrist even a little. CAUSES Often, wrist sprains are a result of a fall or an injury. The force of the impact causes the fibers of your ligament to stretch too much or tear. Common causes of wrist sprains include:  Overextending your wrist while catching a ball with your hands.  Repetitive or strenuous extension or bending of your wrist.  Landing on your hand during a fall. RISK FACTORS  Having previous wrist injuries.  Playing contact sports, such as boxing or wrestling.  Participating in activities in which falling is common.  Having poor wrist strength and flexibility. SIGNS AND SYMPTOMS  Wrist pain.  Wrist tenderness.  Inflammation or bruising of the wrist area.  Hearing a "pop" or feeling a tear at the time of the injury.  Decreased wrist movement due to pain, stiffness, or weakness. DIAGNOSIS Your health care provider will examine your wrist. In some cases, an X-ray will be taken to make sure you did not break any bones. If your health care provider thinks that you tore a ligament, he or she may order an MRI of your wrist. TREATMENT Treatment involves resting and icing your wrist. You may also  need to take pain medicines to help lessen pain and inflammation. Your health care provider may recommend keeping your wrist still (immobilized) with a splint to help your sprain heal. When the splint is no longer necessary, you may need to perform strengthening and stretching exercises. These exercises help you to regain strength and full range of motion in your wrist. Surgery is not usually needed for wrist sprains unless the ligament completely tears. HOME CARE INSTRUCTIONS  Rest your wrist. Do not do things that cause pain.  Wear your wrist splint as directed by your health care provider.  Take medicines only as directed by your health care provider.  To ease pain and swelling, apply ice to the injured area.  Put ice in a plastic bag.  Place a towel between your skin and the bag.  Leave the ice on for 20 minutes, 2-3 times a day. SEEK MEDICAL CARE IF:  Your pain, discomfort, or swelling gets worse even with treatment.  You feel sudden numbness in your hand.   This information is not intended to replace advice given to you by your health care provider. Make sure you discuss any questions you have with your health care provider.   Document Released: 04/17/2014 Document Reviewed: 04/17/2014 Elsevier Interactive Patient Education [employer]2016 [employer]

## 2016-12-23 ENCOUNTER — Encounter (HOSPITAL_COMMUNITY): Payer: Self-pay | Admitting: Emergency Medicine

## 2016-12-23 ENCOUNTER — Emergency Department (HOSPITAL_COMMUNITY)
Admission: EM | Admit: 2016-12-23 | Discharge: 2016-12-23 | Disposition: A | Discharge status: Home / Self Care | Payer: BLUE CROSS/BLUE SHIELD | Attending: Emergency Medicine | Admitting: Emergency Medicine

## 2016-12-23 ENCOUNTER — Emergency Department (HOSPITAL_COMMUNITY): Payer: BLUE CROSS/BLUE SHIELD

## 2016-12-23 DIAGNOSIS — S63501A Unspecified sprain of right wrist, initial encounter: Secondary | ICD-10-CM | POA: Insufficient documentation

## 2016-12-23 DIAGNOSIS — Y939 Activity, unspecified: Secondary | ICD-10-CM | POA: Insufficient documentation

## 2016-12-23 DIAGNOSIS — Y929 Unspecified place or not applicable: Secondary | ICD-10-CM | POA: Insufficient documentation

## 2016-12-23 DIAGNOSIS — W1839XA Other fall on same level, initial encounter: Secondary | ICD-10-CM | POA: Insufficient documentation

## 2016-12-23 DIAGNOSIS — Y999 Unspecified external cause status: Secondary | ICD-10-CM | POA: Insufficient documentation

## 2016-12-23 DIAGNOSIS — Z79899 Other long term (current) drug therapy: Secondary | ICD-10-CM | POA: Insufficient documentation

## 2016-12-23 DIAGNOSIS — F17210 Nicotine dependence, cigarettes, uncomplicated: Secondary | ICD-10-CM | POA: Insufficient documentation

## 2016-12-23 MED ORDER — TRAMADOL HCL 50 MG PO TABS: 50.0000 mg | ORAL_TABLET | Freq: Four times a day (QID) | ORAL | 0 refills | Status: DC | PRN
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
## 2016-12-23 NOTE — ED Notes (Signed)
From radiology 
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
## 2016-12-23 NOTE — Discharge Instructions (Signed)
Elevate and apply ice packs on/off to your wrist.  Ibuprofen 800 mg 3 times a day with food.  Call Dr. Mort Sawyers office to arrange a follow-up appt in one week if not improving

## 2019-04-02 ENCOUNTER — Telehealth: Payer: Self-pay

## 2019-04-02 DIAGNOSIS — Z20822 Contact with and (suspected) exposure to covid-19: Secondary | ICD-10-CM

## 2019-04-02 NOTE — Telephone Encounter (Signed)
Lab

## 2019-04-03 LAB — NOVEL CORONAVIRUS, NAA: SARS-CoV-2, NAA: NOT DETECTED

## 2021-02-21 ENCOUNTER — Other Ambulatory Visit (HOSPITAL_COMMUNITY): Payer: Self-pay | Admitting: Family Medicine

## 2021-02-21 DIAGNOSIS — Z1231 Encounter for screening mammogram for malignant neoplasm of breast: Secondary | ICD-10-CM

## 2021-03-07 ENCOUNTER — Ambulatory Visit (HOSPITAL_COMMUNITY): Payer: BLUE CROSS/BLUE SHIELD

## 2021-03-16 ENCOUNTER — Ambulatory Visit (HOSPITAL_COMMUNITY): Payer: BLUE CROSS/BLUE SHIELD

## 2021-03-23 ENCOUNTER — Ambulatory Visit (HOSPITAL_COMMUNITY): Payer: BLUE CROSS/BLUE SHIELD

## 2021-03-28 ENCOUNTER — Ambulatory Visit (HOSPITAL_COMMUNITY): Payer: BLUE CROSS/BLUE SHIELD

## 2021-04-06 ENCOUNTER — Ambulatory Visit (HOSPITAL_COMMUNITY)
Admission: RE | Admit: 2021-04-06 | Discharge: 2021-04-06 | Disposition: A | Discharge status: Home / Self Care | Payer: 59 | Source: Ambulatory Visit | Attending: Family Medicine | Admitting: Family Medicine

## 2021-04-06 ENCOUNTER — Other Ambulatory Visit: Payer: Self-pay

## 2021-04-06 DIAGNOSIS — Z1231 Encounter for screening mammogram for malignant neoplasm of breast: Secondary | ICD-10-CM | POA: Diagnosis not present

## 2021-04-15 ENCOUNTER — Ambulatory Visit
Admission: EM | Admit: 2021-04-15 | Discharge: 2021-04-15 | Disposition: A | Discharge status: Home / Self Care | Payer: 59 | Attending: Emergency Medicine | Admitting: Emergency Medicine

## 2021-04-15 ENCOUNTER — Encounter: Payer: Self-pay | Admitting: Emergency Medicine

## 2021-04-15 ENCOUNTER — Other Ambulatory Visit: Payer: Self-pay

## 2021-04-15 DIAGNOSIS — J069 Acute upper respiratory infection, unspecified: Secondary | ICD-10-CM

## 2021-04-15 DIAGNOSIS — Z1152 Encounter for screening for COVID-19: Secondary | ICD-10-CM

## 2021-04-15 MED ORDER — BENZONATATE 100 MG PO CAPS: 100.0000 mg | ORAL_CAPSULE | Freq: Three times a day (TID) | ORAL | 0 refills | Status: DC
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
## 2021-04-15 NOTE — ED Provider Notes (Signed)
[facility]   [id number] 04/15/21 Arrival Time: 1250   CC: COVID symptoms  SUBJECTIVE: History from: patient.  Lacey Woodard is a 50 y.o. female who presents with fever, congestion, cough, and nausea that began this morning.  Son tested positive for covid.  Denies alleviating or aggravating factors.  Reports previous symptoms in the past with covid.   Denies SOB, wheezing, chest pain, nausea, changes in bowel or bladder habits.    ROS: As per HPI.  All other pertinent ROS negative.     History reviewed. No pertinent past medical history. Past Surgical History:  Procedure Laterality Date   ABDOMINAL HYSTERECTOMY     No Known Allergies No current facility-administered medications on file prior to encounter.   Current Outpatient Medications on File Prior to Encounter  Medication Sig Dispense Refill   ALPRAZolam (XANAX) 0.5 MG tablet Take 1 tablet by mouth at bedtime as needed for sleep.   0   diclofenac (VOLTAREN) 75 MG EC tablet Take 1 tablet (75 mg total) by mouth 2 (two) times daily. 14 tablet 0   HYDROcodone-acetaminophen (NORCO/VICODIN) 5-325 MG tablet Take 1 tablet by mouth every 4 (four) hours as needed. 15 tablet 0   loratadine (CLARITIN) 10 MG tablet Take 10 mg by mouth daily as needed for allergies.     traMADol (ULTRAM) 50 MG tablet Take 1 tablet (50 mg total) by mouth every 6 (six) hours as needed. 15 tablet 0   venlafaxine XR (EFFEXOR-XR) 150 MG 24 hr capsule Take 1 capsule by mouth at bedtime.  0   Social History   Socioeconomic History   Marital status: Divorced    Spouse name: Not on file   Number of children: Not on file   Years of education: Not on file   Highest education level: Not on file  Occupational History   Not on file  Tobacco Use   Smoking status: Every Day    Packs/day: 1.00    Types: Cigarettes   Smokeless tobacco: Not on file  Substance and Sexual Activity   Alcohol use: No   Drug use: No   Sexual activity: Not on file   Other Topics Concern   Not on file  Social History Narrative   Not on file   Social Determinants of Health   Financial Resource Strain: Not on file  Food Insecurity: Not on file  Transportation Needs: Not on file  Physical Activity: Not on file  Stress: Not on file  Social Connections: Not on file  Intimate Partner Violence: Not on file   No family history on file.  OBJECTIVE:  Vitals:   04/15/21 1317  BP: 126/72  Pulse: 86  Resp: 19  Temp: 98.2 F (36.8 C)  TempSrc: Oral  SpO2: 96%     General appearance: alert; appears fatigued, but nontoxic; speaking in full sentences and tolerating own secretions HEENT: NCAT; Ears: EACs clear, TMs pearly gray; Eyes: PERRL.  EOM grossly intact. Sinuses: nontender; Nose: nares patent without rhinorrhea, Throat: oropharynx clear, tonsils non erythematous or enlarged, uvula midline  Neck: supple without LAD Lungs: unlabored respirations, symmetrical air entry; cough: absent; no respiratory distress; CTAB Heart: regular rate and rhythm.  Radial pulses 2+ symmetrical bilaterally Skin: warm and dry Psychological: alert and cooperative; normal mood and affect  ASSESSMENT & PLAN:  1. Viral URI with cough     Meds ordered this encounter  Medications   benzonatate (TESSALON) 100 MG capsule    Sig: Take 1 capsule (100  mg total) by mouth every 8 (eight) hours.    Dispense:  21 capsule    Refill:  0    Order Specific Question:   Supervising Provider    Answer:   Eustace Moore [[id number]]   COVID testing ordered.  It will take between 5-7 days for test results.  Someone will contact you regarding abnormal results.    In the meantime: You should remain isolated in your home for 5 days from symptom onset AND greater than 72 hours after symptoms resolution (absence of fever without the use of fever-reducing medication and improvement in respiratory symptoms), whichever is longer Get plenty of rest and push fluids Tessalon Perles  prescribed for cough Use OTC zyrtec for nasal congestion, runny nose, and/or sore throat Use OTC flonase for nasal congestion and runny nose Use medications daily for symptom relief Use OTC medications like ibuprofen or tylenol as needed fever or pain Call or go to the ED if you have any new or worsening symptoms such as fever, worsening cough, shortness of breath, chest tightness, chest pain, turning blue, changes in mental status, etc...   Reviewed expectations re: course of current medical issues. Questions answered. Outlined signs and symptoms indicating need for more acute intervention. Patient verbalized understanding. After Visit Summary given.          Rennis Harding, PA-C 04/15/21 1344

## 2021-04-15 NOTE — Discharge Instructions (Addendum)

## 2021-04-17 LAB — COVID-19, FLU A+B NAA
Influenza A, NAA: NOT DETECTED
Influenza B, NAA: NOT DETECTED
SARS-CoV-2, NAA: NOT DETECTED

## 2021-06-02 ENCOUNTER — Encounter: Payer: Self-pay | Admitting: Internal Medicine

## 2021-10-24 NOTE — Progress Notes (Deleted)
? ?Referring Provider: Gareth Morgan, MD ?Primary Care Physician:  Gareth Morgan, MD ?Primary Gastroenterologist:  Dr. Marletta Lor ? ?No chief complaint on file. ? ? ?HPI:   ?Lacey Woodard is a 51 y.o. female presenting today at the request of Gareth Morgan, MD for consult colonoscopy, office visit due to medications. ? ? ? ?No past medical history on file. ? ?Past Surgical History:  ?Procedure Laterality Date  ? ABDOMINAL HYSTERECTOMY    ? ? ?Current Outpatient Medications  ?Medication Sig Dispense Refill  ? ALPRAZolam (XANAX) 0.5 MG tablet Take 1 tablet by mouth at bedtime as needed for sleep.   0  ? benzonatate (TESSALON) 100 MG capsule Take 1 capsule (100 mg total) by mouth every 8 (eight) hours. 21 capsule 0  ? diclofenac (VOLTAREN) 75 MG EC tablet Take 1 tablet (75 mg total) by mouth 2 (two) times daily. 14 tablet 0  ? HYDROcodone-acetaminophen (NORCO/VICODIN) 5-325 MG tablet Take 1 tablet by mouth every 4 (four) hours as needed. 15 tablet 0  ? loratadine (CLARITIN) 10 MG tablet Take 10 mg by mouth daily as needed for allergies.    ? traMADol (ULTRAM) 50 MG tablet Take 1 tablet (50 mg total) by mouth every 6 (six) hours as needed. 15 tablet 0  ? venlafaxine XR (EFFEXOR-XR) 150 MG 24 hr capsule Take 1 capsule by mouth at bedtime.  0  ? ?No current facility-administered medications for this visit.  ? ? ?Allergies as of 10/26/2021  ? (No Known Allergies)  ? ? ?No family history on file. ? ?Social History  ? ?Socioeconomic History  ? Marital status: Divorced  ?  Spouse name: Not on file  ? Number of children: Not on file  ? Years of education: Not on file  ? Highest education level: Not on file  ?Occupational History  ? Not on file  ?Tobacco Use  ? Smoking status: Every Day  ?  Packs/day: 1.00  ?  Types: Cigarettes  ? Smokeless tobacco: Not on file  ?Substance and Sexual Activity  ? Alcohol use: No  ? Drug use: No  ? Sexual activity: Not on file  ?Other Topics Concern  ? Not on file  ?Social History  Narrative  ? Not on file  ? ?Social Determinants of Health  ? ?Financial Resource Strain: Not on file  ?Food Insecurity: Not on file  ?Transportation Needs: Not on file  ?Physical Activity: Not on file  ?Stress: Not on file  ?Social Connections: Not on file  ?Intimate Partner Violence: Not on file  ? ? ?Review of Systems: ?Gen: Denies any fever, chills, fatigue, weight loss, lack of appetite.  ?CV: Denies chest pain, heart palpitations, peripheral edema, syncope.  ?Resp: Denies shortness of breath at rest or with exertion. Denies wheezing or cough.  ?GI: Denies dysphagia or odynophagia. Denies jaundice, hematemesis, fecal incontinence. ?GU : Denies urinary burning, urinary frequency, urinary hesitancy ?MS: Denies joint pain, muscle weakness, cramps, or limitation of movement.  ?Derm: Denies rash, itching, dry skin ?Psych: Denies depression, anxiety, memory loss, and confusion ?Heme: Denies bruising, bleeding, and enlarged lymph nodes. ? ?Physical Exam: ?There were no vitals taken for this visit. ?General:   Alert and oriented. Pleasant and cooperative. Well-nourished and well-developed.  ?Head:  Normocephalic and atraumatic. ?Eyes:  Without icterus, sclera clear and conjunctiva pink.  ?Ears:  Normal auditory acuity. ?Nose:  No deformity, discharge,  or lesions. ?Mouth:  No deformity or lesions, oral mucosa pink.  ?Neck:  Supple, without mass or thyromegaly. ?  Lungs:  Clear to auscultation bilaterally. No wheezes, rales, or rhonchi. No distress.  ?Heart:  S1, S2 present without murmurs appreciated.  ?Abdomen:  +BS, soft, non-tender and non-distended. No HSM noted. No guarding or rebound. No masses appreciated.  ?Rectal:  Deferred  ?Msk:  Symmetrical without gross deformities. Normal posture. ?Pulses:  Normal pulses noted. ?Extremities:  Without clubbing or edema. ?Neurologic:  Alert and  oriented x4;  grossly normal neurologically. ?Skin:  Intact without significant lesions or rashes. ?Cervical Nodes:  No significant  cervical adenopathy. ?Psych:  Alert and cooperative. Normal mood and affect.  ?

## 2021-10-26 ENCOUNTER — Ambulatory Visit: Payer: Self-pay | Admitting: Gastroenterology

## 2022-01-30 NOTE — Progress Notes (Deleted)
Referring Provider: Lemmie Evens, MD Primary Care Physician:  Lemmie Evens, MD Primary Gastroenterologist:  Dr. Abbey Chatters  No chief complaint on file.   HPI:   Lacey Woodard is a 51 y.o. female presenting today at the request of Lemmie Evens, MD for consult colonoscopy.    No past medical history on file.  Past Surgical History:  Procedure Laterality Date   ABDOMINAL HYSTERECTOMY      Current Outpatient Medications  Medication Sig Dispense Refill   ALPRAZolam (XANAX) 0.5 MG tablet Take 1 tablet by mouth at bedtime as needed for sleep.   0   benzonatate (TESSALON) 100 MG capsule Take 1 capsule (100 mg total) by mouth every 8 (eight) hours. 21 capsule 0   diclofenac (VOLTAREN) 75 MG EC tablet Take 1 tablet (75 mg total) by mouth 2 (two) times daily. 14 tablet 0   HYDROcodone-acetaminophen (NORCO/VICODIN) 5-325 MG tablet Take 1 tablet by mouth every 4 (four) hours as needed. 15 tablet 0   loratadine (CLARITIN) 10 MG tablet Take 10 mg by mouth daily as needed for allergies.     traMADol (ULTRAM) 50 MG tablet Take 1 tablet (50 mg total) by mouth every 6 (six) hours as needed. 15 tablet 0   venlafaxine XR (EFFEXOR-XR) 150 MG 24 hr capsule Take 1 capsule by mouth at bedtime.  0   No current facility-administered medications for this visit.    Allergies as of 02/01/2022   (No Known Allergies)    No family history on file.  Social History   Socioeconomic History   Marital status: Divorced    Spouse name: Not on file   Number of children: Not on file   Years of education: Not on file   Highest education level: Not on file  Occupational History   Not on file  Tobacco Use   Smoking status: Every Day    Packs/day: 1.00    Types: Cigarettes   Smokeless tobacco: Not on file  Substance and Sexual Activity   Alcohol use: No   Drug use: No   Sexual activity: Not on file  Other Topics Concern   Not on file  Social History Narrative   Not on file   Social  Determinants of Health   Financial Resource Strain: Not on file  Food Insecurity: Not on file  Transportation Needs: Not on file  Physical Activity: Not on file  Stress: Not on file  Social Connections: Not on file  Intimate Partner Violence: Not on file    Review of Systems: Gen: Denies any fever, chills, cold or flulike symptoms, presyncope, syncope. CV: Denies chest pain, heart palpitations. Resp: Denies shortness of breath, cough. GI: See HPI GU : Denies urinary burning, urinary frequency, urinary hesitancy MS: Denies joint pain. Derm: Denies rash. Psych: Denies depression, anxiety. Heme: See HPI  Physical Exam: There were no vitals taken for this visit. General:   Alert and oriented. Pleasant and cooperative. Well-nourished and well-developed.  Head:  Normocephalic and atraumatic. Eyes:  Without icterus, sclera clear and conjunctiva pink.  Ears:  Normal auditory acuity. Lungs:  Clear to auscultation bilaterally. No wheezes, rales, or rhonchi. No distress.  Heart:  S1, S2 present without murmurs appreciated.  Abdomen:  +BS, soft, non-tender and non-distended. No HSM noted. No guarding or rebound. No masses appreciated.  Rectal:  Deferred  Msk:  Symmetrical without gross deformities. Normal posture. Extremities:  Without edema. Neurologic:  Alert and  oriented x4;  grossly normal neurologically. Skin:  Intact  without significant lesions or rashes. Psych:  Normal mood and affect.    Assessment:     Plan:  ***   Aliene Altes, PA-C [facility] 02/01/2022

## 2022-02-01 ENCOUNTER — Ambulatory Visit: Payer: Self-pay | Admitting: Gastroenterology

## 2022-03-04 IMAGING — MG MM DIGITAL SCREENING BILAT W/ TOMO AND CAD
8 series · 9 of 24 positions shown · non-contrast
Comparison: None.

CLINICAL DATA: Screening.

EXAM:
DIGITAL SCREENING BILATERAL MAMMOGRAM WITH TOMOSYNTHESIS AND CAD
TECHNIQUE: Bilateral screening digital craniocaudal and mediolateral oblique
mammograms were obtained. Bilateral screening digital breast
tomosynthesis was performed. The images were evaluated with
computer-aided detection.

[L MLO synth-2D]
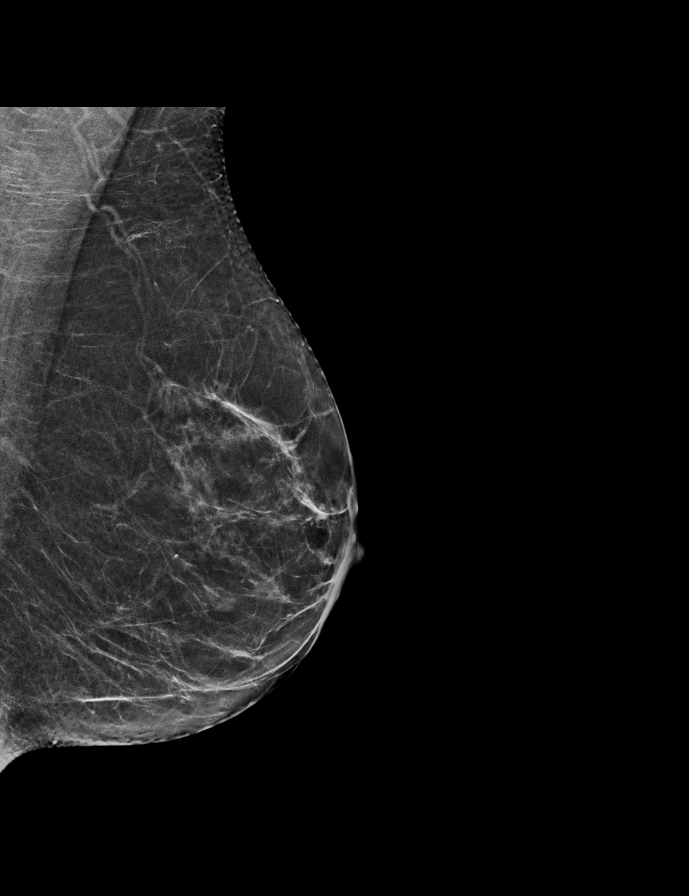

[R MLO synth-2D]
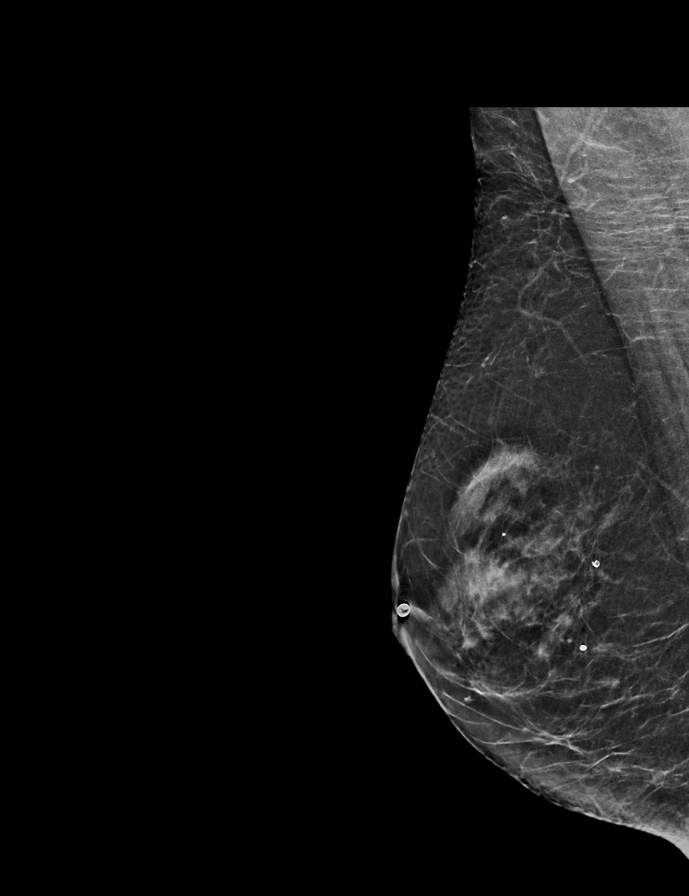

[L CC synth-2D]
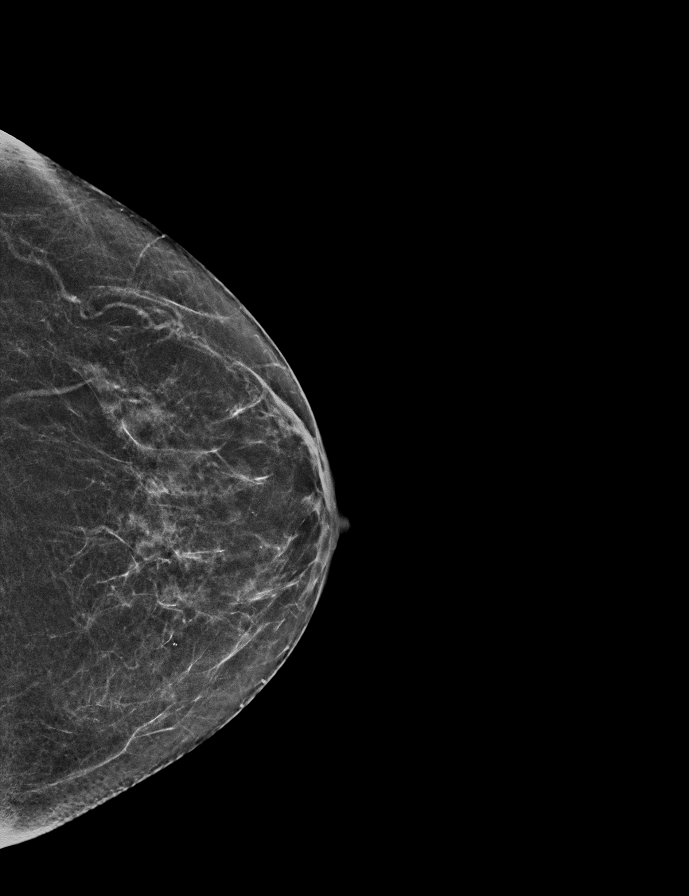

[R CC synth-2D]
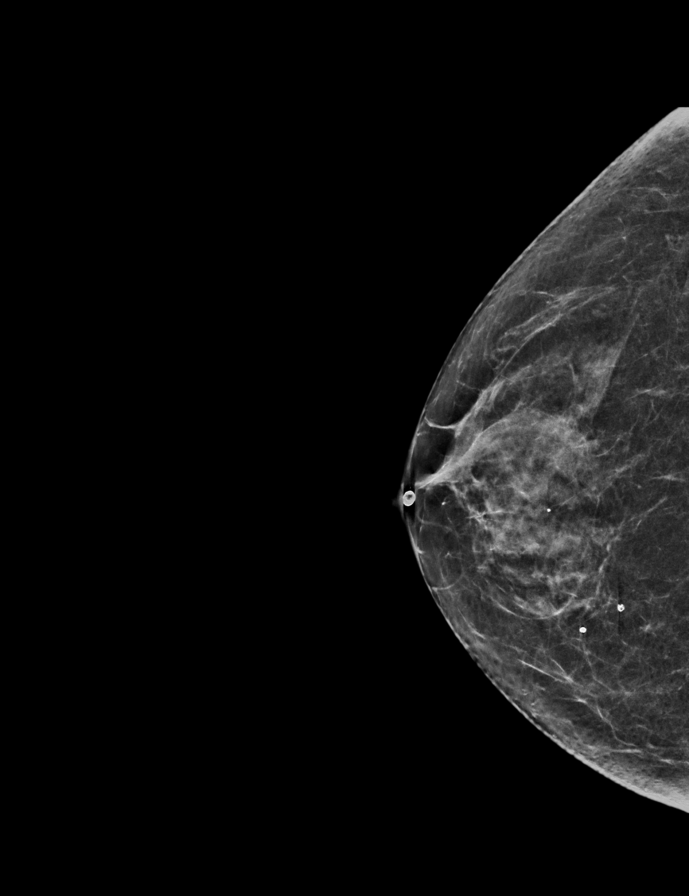

[R MLO tomo · 2 of 54 frames shown]
[frame 18/54]
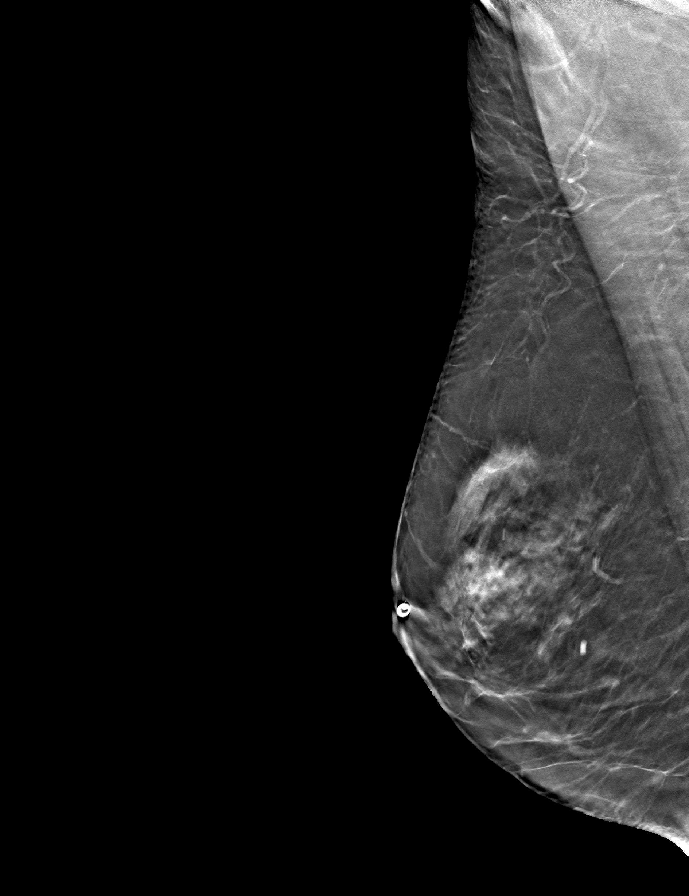
[frame 27/54]
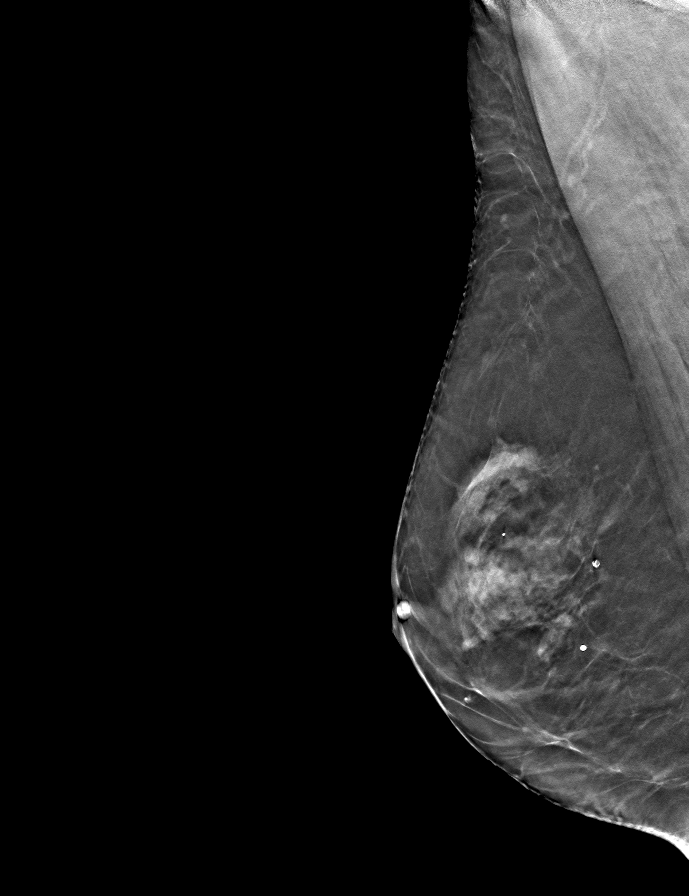

[R CC tomo · tomo slice 27/52.0]
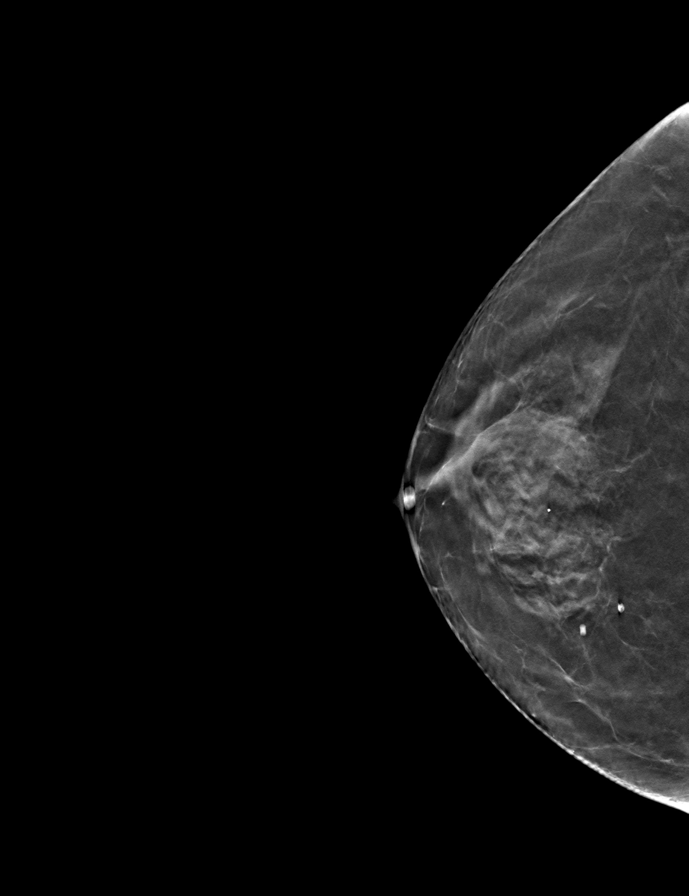

[L CC tomo · tomo slice 27/52.0]
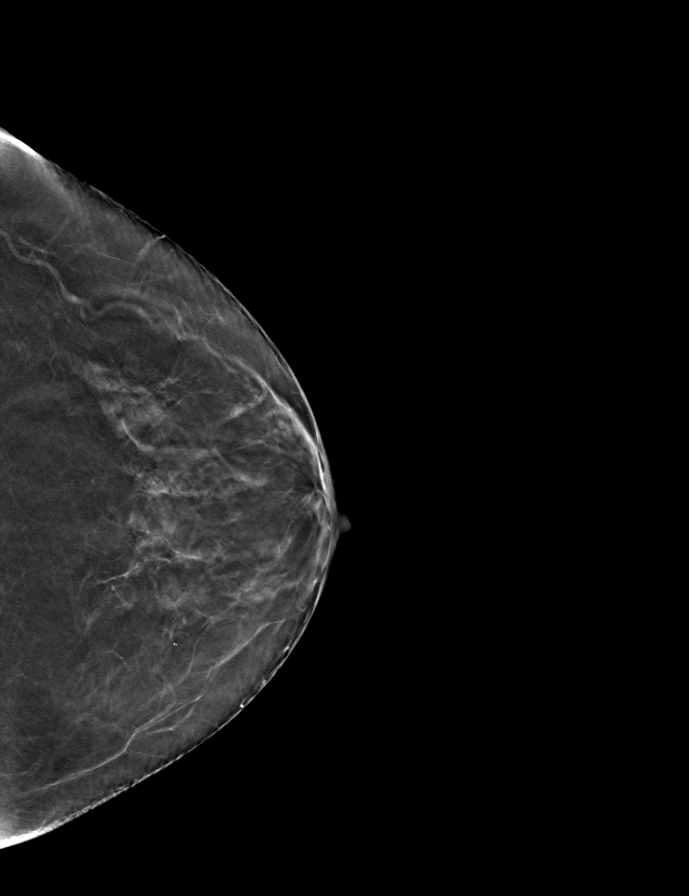

[L MLO tomo · tomo slice 27/53.0]
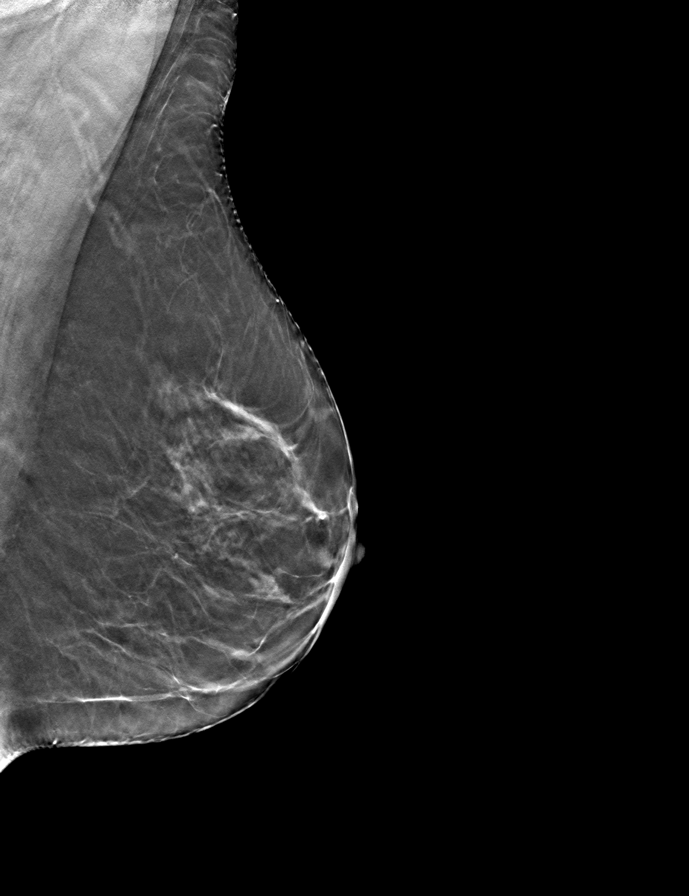

[9 of 24 positions shown; findings below may reference images not displayed]

ACR Breast Density Category b: There are scattered areas of
fibroglandular density.
FINDINGS: There are no findings suspicious for malignancy.
IMPRESSION: No mammographic evidence of malignancy. A result letter of this
screening mammogram will be mailed directly to the patient.

RECOMMENDATION:
Screening mammogram in one year. (Code:XG-X-X7B)

BI-RADS CATEGORY  1: Negative.

## 2022-03-26 NOTE — Progress Notes (Deleted)
Referring Provider:Knowlton, Brett Canales, MD Primary Care Physician:  Gareth Morgan, MD Primary Gastroenterologist:  Dr. Marletta Lor  No chief complaint on file.   HPI:   Lacey Woodard is a 51 y.o. female presenting today at the request of Gareth Morgan, MD for consult colonoscopy.  Prior colonoscopy:  Fhx:   No past medical history on file.  Past Surgical History:  Procedure Laterality Date   ABDOMINAL HYSTERECTOMY      Current Outpatient Medications  Medication Sig Dispense Refill   ALPRAZolam (XANAX) 0.5 MG tablet Take 1 tablet by mouth at bedtime as needed for sleep.   0   benzonatate (TESSALON) 100 MG capsule Take 1 capsule (100 mg total) by mouth every 8 (eight) hours. 21 capsule 0   diclofenac (VOLTAREN) 75 MG EC tablet Take 1 tablet (75 mg total) by mouth 2 (two) times daily. 14 tablet 0   HYDROcodone-acetaminophen (NORCO/VICODIN) 5-325 MG tablet Take 1 tablet by mouth every 4 (four) hours as needed. 15 tablet 0   loratadine (CLARITIN) 10 MG tablet Take 10 mg by mouth daily as needed for allergies.     traMADol (ULTRAM) 50 MG tablet Take 1 tablet (50 mg total) by mouth every 6 (six) hours as needed. 15 tablet 0   venlafaxine XR (EFFEXOR-XR) 150 MG 24 hr capsule Take 1 capsule by mouth at bedtime.  0   No current facility-administered medications for this visit.    Allergies as of 03/27/2022   (No Known Allergies)    No family history on file.  Social History   Socioeconomic History   Marital status: Divorced    Spouse name: Not on file   Number of children: Not on file   Years of education: Not on file   Highest education level: Not on file  Occupational History   Not on file  Tobacco Use   Smoking status: Every Day    Packs/day: 1.00    Types: Cigarettes   Smokeless tobacco: Not on file  Substance and Sexual Activity   Alcohol use: No   Drug use: No   Sexual activity: Not on file  Other Topics Concern   Not on file  Social History Narrative   Not  on file   Social Determinants of Health   Financial Resource Strain: Not on file  Food Insecurity: Not on file  Transportation Needs: Not on file  Physical Activity: Not on file  Stress: Not on file  Social Connections: Not on file  Intimate Partner Violence: Not on file    Review of Systems: Gen: Denies any fever, chills, cold or flu like symptoms, pre-syncope or syncope.  CV: Denies chest pain, heart palpitations. Resp: Denies shortness of breath, cough.  GI: See HPI GU : Denies urinary burning, urinary frequency, urinary hesitancy MS: Denies joint pain. Derm: Denies rash, itching, dry skin Psych: Denies depression, anxiety, memory loss, and confusion Heme: See HPI  Physical Exam: There were no vitals taken for this visit. General:   Alert and oriented. Pleasant and cooperative. Well-nourished and well-developed.  Head:  Normocephalic and atraumatic. Eyes:  Without icterus, sclera clear and conjunctiva pink.  Ears:  Normal auditory acuity. Lungs:  Clear to auscultation bilaterally. No wheezes, rales, or rhonchi. No distress.  Heart:  S1, S2 present without murmurs appreciated.  Abdomen:  +BS, soft, non-tender and non-distended. No HSM noted. No guarding or rebound. No masses appreciated.  Rectal:  Deferred  Msk:  Symmetrical without gross deformities. Normal posture. Extremities:  Without edema.  Neurologic:  Alert and  oriented x4;  grossly normal neurologically. Skin:  Intact without significant lesions or rashes. Psych:  Normal mood and affect.    Assessment:     Plan:  ***   Ermalinda Memos, PA-C [facility] 03/27/2022

## 2022-03-27 ENCOUNTER — Ambulatory Visit: Payer: Self-pay | Admitting: Gastroenterology

## 2022-04-27 ENCOUNTER — Ambulatory Visit: Payer: 59 | Admitting: Internal Medicine

## 2022-05-31 ENCOUNTER — Other Ambulatory Visit (HOSPITAL_COMMUNITY): Payer: Self-pay | Admitting: Nurse Practitioner

## 2022-05-31 DIAGNOSIS — R69 Illness, unspecified: Secondary | ICD-10-CM | POA: Diagnosis not present

## 2022-05-31 DIAGNOSIS — J019 Acute sinusitis, unspecified: Secondary | ICD-10-CM | POA: Diagnosis not present

## 2022-05-31 DIAGNOSIS — Z1231 Encounter for screening mammogram for malignant neoplasm of breast: Secondary | ICD-10-CM

## 2022-05-31 DIAGNOSIS — Z79891 Long term (current) use of opiate analgesic: Secondary | ICD-10-CM | POA: Diagnosis not present

## 2022-05-31 DIAGNOSIS — J3089 Other allergic rhinitis: Secondary | ICD-10-CM | POA: Diagnosis not present

## 2022-05-31 DIAGNOSIS — Z79899 Other long term (current) drug therapy: Secondary | ICD-10-CM | POA: Diagnosis not present

## 2022-06-14 ENCOUNTER — Encounter: Payer: Self-pay | Admitting: *Deleted

## 2022-07-03 ENCOUNTER — Ambulatory Visit (HOSPITAL_COMMUNITY): Payer: 59

## 2022-07-10 ENCOUNTER — Encounter: Payer: Self-pay | Admitting: *Deleted

## 2022-07-10 NOTE — Patient Instructions (Signed)
  Procedure: Colonoscopy  Estimated body mass index is 23.4 kg/m as calculated from the following:   Height as of this encounter: 5\' 6"  (1.676 m).   Weight as of this encounter: 145 lb (65.8 kg).   Have you had a colonoscopy before?  no  Do you have family history of colon cancer  no  Do you have a family history of polyps? yes  Previous colonoscopy with polyps removed? no  Do you have a history colorectal cancer?   no  Are you diabetic?  no  Do you have a prosthetic or mechanical heart valve? no  Do you have a pacemaker/defibrillator?   no  Have you had endocarditis/atrial fibrillation?  no  Do you use supplemental oxygen/CPAP?  no  Have you had joint replacement within the last 12 months?  no  Do you tend to be constipated or have to use laxatives?  yes   Do you have history of alcohol use? If yes, how much and how often.  no  Do you have history or are you using drugs? If yes, what do are you  using?  no  Have you ever had a stroke/heart attack?  no  Have you ever had a heart or other vascular stent placed,?no  Do you take weight loss medication? no  female patients,: have you had a hysterectomy? yes                              are you post menopausal?  no                              do you still have your menstrual cycle? no    Date of last menstrual period.   Do you take any blood-thinning medications such as: (Plavix, aspirin, Coumadin, Aggrenox, Brilinta, Xarelto, Eliquis, Pradaxa, Savaysa or Effient) no  If yes we need the name, milligram, dosage and who is prescribing doctor:               Current Outpatient Medications  Medication Sig Dispense Refill   ALPRAZolam (XANAX) 0.5 MG tablet Take 1 tablet by mouth at bedtime as needed for sleep.   0   Cholecalciferol (VITAMIN D3) 25 MCG (1000 UT) CAPS Take by mouth daily.     venlafaxine XR (EFFEXOR-XR) 150 MG 24 hr capsule Take 1 capsule by mouth at bedtime.  0   No current facility-administered  medications for this visit.    No Known Allergies

## 2022-07-12 ENCOUNTER — Ambulatory Visit (HOSPITAL_COMMUNITY): Payer: 59

## 2022-07-19 ENCOUNTER — Encounter: Payer: Self-pay | Admitting: *Deleted

## 2022-07-19 MED ORDER — PEG 3350-KCL-NA BICARB-NACL 420 G PO SOLR: 4000.0000 mL | Freq: Once | ORAL | 0 refills | Status: AC

## 2022-07-19 NOTE — Progress Notes (Signed)
Spoke with pt. Scheduled for 12/12 at 1pm. Awre will send instructions and rx for prep to pharmacy.

## 2022-07-19 NOTE — Progress Notes (Signed)
Ok to schedule. ASA 2.  Give bisacodyl 10mg  daily for 3 days before prep starts.

## 2022-07-19 NOTE — Progress Notes (Signed)
Referral completed

## 2022-08-01 ENCOUNTER — Encounter: Payer: 59 | Admitting: Obstetrics & Gynecology

## 2022-08-08 ENCOUNTER — Other Ambulatory Visit: Payer: Self-pay

## 2022-08-08 ENCOUNTER — Ambulatory Visit (HOSPITAL_COMMUNITY): Payer: 59 | Admitting: Anesthesiology

## 2022-08-08 ENCOUNTER — Ambulatory Visit (HOSPITAL_COMMUNITY)
Admission: RE | Admit: 2022-08-08 | Discharge: 2022-08-08 | Disposition: A | Discharge status: Home / Self Care | Payer: 59 | Attending: Internal Medicine | Admitting: Internal Medicine

## 2022-08-08 ENCOUNTER — Encounter (HOSPITAL_COMMUNITY)
Admission: RE | Disposition: A | Discharge status: Home / Self Care | Payer: Self-pay | Source: Home / Self Care | Attending: Internal Medicine

## 2022-08-08 ENCOUNTER — Ambulatory Visit (HOSPITAL_BASED_OUTPATIENT_CLINIC_OR_DEPARTMENT_OTHER): Payer: 59 | Admitting: Anesthesiology

## 2022-08-08 ENCOUNTER — Encounter (HOSPITAL_COMMUNITY): Payer: Self-pay

## 2022-08-08 DIAGNOSIS — Z1211 Encounter for screening for malignant neoplasm of colon: Secondary | ICD-10-CM | POA: Insufficient documentation

## 2022-08-08 DIAGNOSIS — Z1212 Encounter for screening for malignant neoplasm of rectum: Secondary | ICD-10-CM

## 2022-08-08 DIAGNOSIS — F17210 Nicotine dependence, cigarettes, uncomplicated: Secondary | ICD-10-CM | POA: Diagnosis not present

## 2022-08-08 DIAGNOSIS — F419 Anxiety disorder, unspecified: Secondary | ICD-10-CM | POA: Diagnosis not present

## 2022-08-08 DIAGNOSIS — K648 Other hemorrhoids: Secondary | ICD-10-CM | POA: Insufficient documentation

## 2022-08-08 DIAGNOSIS — K5730 Diverticulosis of large intestine without perforation or abscess without bleeding: Secondary | ICD-10-CM | POA: Insufficient documentation

## 2022-08-08 DIAGNOSIS — R69 Illness, unspecified: Secondary | ICD-10-CM | POA: Diagnosis not present

## 2022-08-08 HISTORY — PX: COLONOSCOPY WITH PROPOFOL: SHX5780

## 2022-08-08 HISTORY — DX: Anxiety disorder, unspecified: F41.9

## 2022-08-08 SURGERY — COLONOSCOPY WITH PROPOFOL
Anesthesia: General

## 2022-08-08 MED ORDER — LACTATED RINGERS IV SOLN: INTRAVENOUS | Status: DC

## 2022-08-08 MED ORDER — PROPOFOL 500 MG/50ML IV EMUL
INTRAVENOUS | Status: DC | PRN
  Administered 2022-08-08: 150 ug/kg/min via INTRAVENOUS

## 2022-08-08 NOTE — Anesthesia Postprocedure Evaluation (Signed)
Anesthesia Post Note  Patient: Lacey Woodard  Procedure(s) Performed: COLONOSCOPY WITH PROPOFOL  Patient location during evaluation: Endoscopy Anesthesia Type: General Level of consciousness: awake and alert Pain management: pain level controlled Vital Signs Assessment: post-procedure vital signs reviewed and stable Respiratory status: spontaneous breathing, nonlabored ventilation and respiratory function stable Cardiovascular status: blood pressure returned to baseline and stable Postop Assessment: no apparent nausea or vomiting Anesthetic complications: no   There were no known notable events for this encounter.   Last Vitals:  Vitals:   08/08/22 1104 08/08/22 1218  BP: 138/76 104/70  Pulse: 92 81  Resp: 20 17  Temp: 37.2 C 36.9 C  SpO2: 96% 97%    Last Pain:  Vitals:   08/08/22 1218  TempSrc: Oral  PainSc: 4                  Glynis Smiles

## 2022-08-08 NOTE — Discharge Instructions (Addendum)

## 2022-08-08 NOTE — Op Note (Signed)
[facility] Patient Name: Lacey Woodard Procedure Date: 08/08/2022 11:46 AM MRN: [id number] Date of Birth: September 14, 1970 Attending MD: Elon Alas. Abbey Chatters , [place], [id number] CSN: [id number] Age: 51 Admit Type: Outpatient Procedure:                Colonoscopy Indications:              Screening for colorectal malignant neoplasm Providers:                Elon Alas. Abbey Chatters, DO, Tammy Vaught, RN, Dereck Leep, Technician Referring MD:              Medicines:                See the Anesthesia note for documentation of the                            administered medications Complications:            No immediate complications. Estimated Blood Loss:     Estimated blood loss: none. Procedure:                Pre-Anesthesia Assessment:                           - The anesthesia plan was to use monitored                            anesthesia care (MAC).                           After obtaining informed consent, the colonoscope                            was passed under direct vision. Throughout the                            procedure, the patient's blood pressure, pulse, and                            oxygen saturations were monitored continuously. The                            [id number]-HQ190L ([id number]) scope was introduced through                            the anus and advanced to the the cecum, identified                            by appendiceal orifice and ileocecal valve. The                            colonoscopy was technically difficult and complex                            due to restricted mobility  of the colon. The                            patient tolerated the procedure well. The quality                            of the bowel preparation was evaluated using the                            BBPS [facility] Bowel Preparation Scale) with scores                            of: Right Colon = 3, Transverse Colon = 3 and Left                             Colon = 3 (entire mucosa seen well with no residual                            staining, small fragments of stool or opaque                            liquid). The total BBPS score equals 9. Scope In: 12:00:18 PM Scope Out: 12:14:06 PM Scope Withdrawal Time: 0 hours 6 minutes 51 seconds  Total Procedure Duration: 0 hours 13 minutes 48 seconds  Findings:      The perianal and digital rectal examinations were normal.      Non-bleeding internal hemorrhoids were found during endoscopy.      A few small-mouthed diverticula were found in the sigmoid colon.      The exam was otherwise without abnormality. Impression:               - Non-bleeding internal hemorrhoids.                           - Diverticulosis in the sigmoid colon.                           - The examination was otherwise normal.                           - No specimens collected. Moderate Sedation:      Per Anesthesia Care Recommendation:           - Patient has a contact number available for                            emergencies. The signs and symptoms of potential                            delayed complications were discussed with the                            patient. Return to normal activities tomorrow.  Written discharge instructions were provided to the                            patient.                           - Resume previous diet.                           - Continue present medications.                           - Repeat colonoscopy in 10 years for screening                            purposes.                           - Return to GI clinic PRN. Procedure Code(s):        --- Professional ---                           U0454, Colorectal cancer screening; colonoscopy on                            individual not meeting criteria for high risk Diagnosis Code(s):        --- Professional ---                           Z12.11, Encounter for screening for malignant                             neoplasm of colon                           K64.8, Other hemorrhoids                           K57.30, Diverticulosis of large intestine without                            perforation or abscess without bleeding CPT copyright 2022 American Medical Association. All rights reserved. The codes documented in this report are preliminary and upon coder review may  be revised to meet current compliance requirements. Elon Alas. Abbey Chatters, DO [place] Abbey Chatters, DO 08/08/2022 12:18:23 PM This report has been signed electronically. Number of Addenda: 0

## 2022-08-08 NOTE — Transfer of Care (Signed)
Immediate Anesthesia Transfer of Care Note  Patient: Lacey Woodard  Procedure(s) Performed: COLONOSCOPY WITH PROPOFOL  Patient Location: PACU  Anesthesia Type:General  Level of Consciousness: awake  Airway & Oxygen Therapy: Patient Spontanous Breathing  Post-op Assessment: Report given to RN and Post -op Vital signs reviewed and stable  Post vital signs: Reviewed and stable  Last Vitals:  Vitals Value Taken Time  BP    Temp    Pulse    Resp    SpO2      Last Pain:  Vitals:   08/08/22 1155  TempSrc:   PainSc: 0-No pain      Patients Stated Pain Goal: 8 (08/08/22 1104)  Complications: No notable events documented.

## 2022-08-08 NOTE — H&P (Signed)
Primary Care Physician:  Gareth Morgan, MD Primary Gastroenterologist:  Dr. Marletta Lor  Pre-Procedure History & Physical: HPI:  Lacey Woodard is a 51 y.o. female is here for first ever colonoscopy for colon cancer screening purposes.  Patient denies any family history of colorectal cancer.  No melena or hematochezia.  No abdominal pain or unintentional weight loss.  No change in bowel habits.  Overall feels well from a GI standpoint.  Past Medical History:  Diagnosis Date   Anxiety     Past Surgical History:  Procedure Laterality Date   ABDOMINAL HYSTERECTOMY     bilateral inguinal hernia repair      Prior to Admission medications   Medication Sig Start Date End Date Taking? Authorizing Provider  ALPRAZolam Prudy Feeler) 0.5 MG tablet Take 0.5 mg by mouth at bedtime as needed for sleep. 10/25/15  Yes [provider]  Biotin 5000 MCG TABS Take 5,000 mcg by mouth daily.   Yes [provider]  Cholecalciferol (VITAMIN D3) 25 MCG (1000 UT) CAPS Take 1,000 Units by mouth daily.   Yes [provider]  magnesium oxide (MAG-OX) 400 (240 Mg) MG tablet Take 400 mg by mouth at bedtime.   Yes [provider]  venlafaxine XR (EFFEXOR-XR) 150 MG 24 hr capsule Take 150 mg by mouth in the morning and at bedtime. 09/27/15  Yes [provider]    Allergies as of 07/19/2022   (No Known Allergies)    Family History  Problem Relation Age of Onset   Colon cancer Maternal Aunt     Social History   Socioeconomic History   Marital status: Divorced    Spouse name: Not on file   Number of children: Not on file   Years of education: Not on file   Highest education level: Not on file  Occupational History   Not on file  Tobacco Use   Smoking status: Every Day    Packs/day: 1.00    Types: Cigarettes   Smokeless tobacco: Not on file  Vaping Use   Vaping Use: Never used  Substance and Sexual Activity   Alcohol use: No   Drug use: No   Sexual activity: Not  on file  Other Topics Concern   Not on file  Social History Narrative   Not on file   Social Determinants of Health   Financial Resource Strain: Not on file  Food Insecurity: Not on file  Transportation Needs: Not on file  Physical Activity: Not on file  Stress: Not on file  Social Connections: Not on file  Intimate Partner Violence: Not on file    Review of Systems: See HPI, otherwise negative ROS  Physical Exam: Vital signs in last 24 hours: Temp:  [98.9 F (37.2 C)] 98.9 F (37.2 C) (12/12 1104) Pulse Rate:  [92] 92 (12/12 1104) Resp:  [20] 20 (12/12 1104) BP: (138)/(76) 138/76 (12/12 1104) SpO2:  [96 %] 96 % (12/12 1104) Weight:  [65.8 kg] 65.8 kg (12/12 1104)   General:   Alert,  Well-developed, well-nourished, pleasant and cooperative in NAD Head:  Normocephalic and atraumatic. Eyes:  Sclera clear, no icterus.   Conjunctiva pink. Ears:  Normal auditory acuity. Nose:  No deformity, discharge,  or lesions. Msk:  Symmetrical without gross deformities. Normal posture. Extremities:  Without clubbing or edema. Neurologic:  Alert and  oriented x4;  grossly normal neurologically. Skin:  Intact without significant lesions or rashes. Psych:  Alert and cooperative. Normal mood and affect.  Impression/Plan: Lacey Cosier  Woodard is here for a colonoscopy to be performed for colon cancer screening purposes.  The risks of the procedure including infection, bleed, or perforation as well as benefits, limitations, alternatives and imponderables have been reviewed with the patient. Questions have been answered. All parties agreeable.

## 2022-08-08 NOTE — Anesthesia Preprocedure Evaluation (Addendum)
Anesthesia Evaluation  Patient identified by MRN, date of birth, ID band Patient awake    Reviewed: Allergy & Precautions, NPO status , Patient's Chart, lab work & pertinent test results  Airway Mallampati: II  TM Distance: >3 FB Neck ROM: Full    Dental  (+) Edentulous Upper, Edentulous Lower   Pulmonary Current Smoker and Patient abstained from smoking.   Pulmonary exam normal        Cardiovascular Exercise Tolerance: Good negative cardio ROS Normal cardiovascular exam     Neuro/Psych  PSYCHIATRIC DISORDERS Anxiety     negative neurological ROS     GI/Hepatic negative GI ROS, Neg liver ROS,,,  Endo/Other  negative endocrine ROS    Renal/GU negative Renal ROS     Musculoskeletal negative musculoskeletal ROS (+)    Abdominal Normal abdominal exam  (+)   Peds  Hematology   Anesthesia Other Findings   Reproductive/Obstetrics                             Anesthesia Physical Anesthesia Plan  ASA: 2  Anesthesia Plan: General   Post-op Pain Management:    Induction: Intravenous  PONV Risk Score and Plan: 1 and TIVA  Airway Management Planned: Nasal Cannula  Additional Equipment:   Intra-op Plan:   Post-operative Plan:   Informed Consent: I have reviewed the patients History and Physical, chart, labs and discussed the procedure including the risks, benefits and alternatives for the proposed anesthesia with the patient or authorized representative who has indicated his/her understanding and acceptance.       Plan Discussed with: CRNA  Anesthesia Plan Comments:        Anesthesia Quick Evaluation

## 2022-08-15 ENCOUNTER — Encounter (HOSPITAL_COMMUNITY): Payer: Self-pay | Admitting: Internal Medicine

## 2022-08-31 ENCOUNTER — Encounter: Payer: 59 | Admitting: Obstetrics & Gynecology

## 2022-09-12 ENCOUNTER — Ambulatory Visit (INDEPENDENT_AMBULATORY_CARE_PROVIDER_SITE_OTHER): Payer: 59 | Admitting: Adult Health

## 2022-09-12 ENCOUNTER — Encounter: Payer: Self-pay | Admitting: Adult Health

## 2022-09-12 VITALS — BP 120/78 | HR 82 | Ht 66.0 in | Wt 145.6 lb

## 2022-09-12 DIAGNOSIS — N898 Other specified noninflammatory disorders of vagina: Secondary | ICD-10-CM | POA: Insufficient documentation

## 2022-09-12 DIAGNOSIS — Z90710 Acquired absence of both cervix and uterus: Secondary | ICD-10-CM | POA: Insufficient documentation

## 2022-09-12 DIAGNOSIS — L659 Nonscarring hair loss, unspecified: Secondary | ICD-10-CM | POA: Insufficient documentation

## 2022-09-12 DIAGNOSIS — N941 Unspecified dyspareunia: Secondary | ICD-10-CM | POA: Diagnosis not present

## 2022-09-12 DIAGNOSIS — R61 Generalized hyperhidrosis: Secondary | ICD-10-CM | POA: Diagnosis not present

## 2022-09-12 DIAGNOSIS — R4589 Other symptoms and signs involving emotional state: Secondary | ICD-10-CM | POA: Insufficient documentation

## 2022-09-12 DIAGNOSIS — Z90721 Acquired absence of ovaries, unilateral: Secondary | ICD-10-CM | POA: Diagnosis not present

## 2022-09-12 DIAGNOSIS — R69 Illness, unspecified: Secondary | ICD-10-CM | POA: Diagnosis not present

## 2022-09-12 MED ORDER — ESTRADIOL 1 MG PO TABS: 1.0000 mg | ORAL_TABLET | Freq: Every day | ORAL | 3 refills | Status: DC

## 2022-09-12 NOTE — Progress Notes (Signed)
Subjective:     Patient ID: Lacey Woodard, female   DOB: March 14, 1971, 52 y.o.   MRN: [id number]  HPI Latravia is a 52 year old white female, divorced, sp hysterectomy, in to discuss hormones. She took estrogen after hysterectomy but stopped. She is having night sweats, vaginal dryness and moody and pain with sex, hair thinning on head and more hair on top lip and has some brown spots on face, and wakes up at night.  PCP is Dr Karie Kirks.  Review of Systems Denies any problems with bowel movement for urination. See HPI for positives Reviewed past medical,surgical, social and family history. Reviewed medications and allergies.     Objective:   Physical Exam BP 120/78 (BP Location: Left Arm, Patient Position: Sitting, Cuff Size: Normal)   Pulse 82   Ht 5\' 6"  (1.676 m)   Wt 145 lb 9.6 oz (66 kg)   BMI 23.50 kg/m     Skin warm and dry.Pelvic: external genitalia is normal in appearance no lesions, vagina is pale with loss of moisture and rugae, vaginal cuff has no lesions,urethra has no lesions or masses noted, cervix and uterus absent, adnexa: no masses or tenderness noted. Bladder is non tender and no masses felt. AA is 0 Fall risk is low    09/12/2022    2:05 PM  Depression screen PHQ 2/9  Decreased Interest 1  Down, Depressed, Hopeless 1  PHQ - 2 Score 2  Altered sleeping 1  Tired, decreased energy 1  Change in appetite 1  Feeling bad or failure about yourself  0  Trouble concentrating 1  Moving slowly or fidgety/restless 0  Suicidal thoughts 0  PHQ-9 Score 6   On effexor and xanax    09/12/2022    2:05 PM  GAD 7 : Generalized Anxiety Score  Nervous, Anxious, on Edge 1  Control/stop worrying 1  Worry too much - different things 1  Trouble relaxing 1  Restless 1  Easily annoyed or irritable 1  Afraid - awful might happen 0  Total GAD 7 Score 6      Upstream - 09/12/22 1405       Pregnancy Intention Screening   Does the patient want to become pregnant in the next  year? N/A    Does the patient's partner want to become pregnant in the next year? N/A    Would the patient like to discuss contraceptive options today? N/A      Contraception Wrap Up   Current Method Female Sterilization   hyst   End Method Female Sterilization   hyst   Contraception Counseling Provided No            Examination chaperoned by Celene Squibb LPN  Assessment:     1. Night sweats Discussed oral estrogen patch and vaginal estrogen will try oral estrogen, as she denies, MI,stroke,DVT, or breast cancer  Meds ordered this encounter  Medications   estradiol (ESTRACE) 1 MG tablet    Sig: Take 1 tablet (1 mg total) by mouth daily.    Dispense:  30 tablet    Refill:  3    Order Specific Question:   Supervising Provider    Answer:   Elonda Husky, LUTHER H [[id number]]     2. Vaginal dryness Will try estrace 1 mg daily Use astroglide with sex  3. Dyspareunia in female Try astro glide with sex Will try estrace  1 mg po  4. S/P hysterectomy with oophorectomy  5. Moody  6. Hair thinning  She said had normal TSH at PCP    Plan:     Follow up in 4 weeks for ROS

## 2022-09-18 DIAGNOSIS — F419 Anxiety disorder, unspecified: Secondary | ICD-10-CM | POA: Diagnosis not present

## 2022-09-18 DIAGNOSIS — R202 Paresthesia of skin: Secondary | ICD-10-CM | POA: Diagnosis not present

## 2022-09-18 DIAGNOSIS — G4700 Insomnia, unspecified: Secondary | ICD-10-CM | POA: Diagnosis not present

## 2022-09-18 DIAGNOSIS — M542 Cervicalgia: Secondary | ICD-10-CM | POA: Diagnosis not present

## 2022-10-10 ENCOUNTER — Ambulatory Visit: Payer: Medicaid Other | Admitting: Adult Health

## 2022-10-11 ENCOUNTER — Ambulatory Visit (INDEPENDENT_AMBULATORY_CARE_PROVIDER_SITE_OTHER): Payer: Medicaid Other | Admitting: Adult Health

## 2022-10-11 ENCOUNTER — Encounter: Payer: Self-pay | Admitting: Adult Health

## 2022-10-11 ENCOUNTER — Ambulatory Visit: Payer: Medicaid Other | Admitting: Adult Health

## 2022-10-11 VITALS — BP 111/71 | HR 86 | Ht 66.0 in | Wt 147.0 lb

## 2022-10-11 DIAGNOSIS — Z90710 Acquired absence of both cervix and uterus: Secondary | ICD-10-CM

## 2022-10-11 DIAGNOSIS — N898 Other specified noninflammatory disorders of vagina: Secondary | ICD-10-CM

## 2022-10-11 DIAGNOSIS — R4589 Other symptoms and signs involving emotional state: Secondary | ICD-10-CM

## 2022-10-11 DIAGNOSIS — Z90721 Acquired absence of ovaries, unilateral: Secondary | ICD-10-CM

## 2022-10-11 DIAGNOSIS — N941 Unspecified dyspareunia: Secondary | ICD-10-CM

## 2022-10-11 DIAGNOSIS — R61 Generalized hyperhidrosis: Secondary | ICD-10-CM

## 2022-10-11 DIAGNOSIS — Z79899 Other long term (current) drug therapy: Secondary | ICD-10-CM

## 2022-10-11 MED ORDER — ESTRADIOL 2 MG PO TABS: 2.0000 mg | ORAL_TABLET | Freq: Every day | ORAL | 6 refills | Status: DC

## 2022-10-11 NOTE — Progress Notes (Signed)
  Subjective:     Patient ID: Lacey Woodard, female   DOB: 03-20-1971, 52 y.o.   MRN: [id number]  HPI Lacey Woodard is a 52 year old white female, divorced, sp hysterectomy , back in follow up on starting estrace 1 mg.  PCP is Dr Karie Kirks.   Review of Systems Vaginal dryness is better Night sweats not as bad Pain with sex is a little better Still moody Hair thinning Reviewed past medical,surgical, social and family history. Reviewed medications and allergies.     Objective:   Physical Exam BP 111/71 (BP Location: Left Arm, Patient Position: Sitting, Cuff Size: Normal)   Pulse 86   Ht 5\' 6"  (1.676 m)   Wt 147 lb (66.7 kg)   BMI 23.73 kg/m     Skin warm and dry.  Lungs: clear to ausculation bilaterally. Cardiovascular: regular rate and rhythm.      10/11/2022    9:39 AM 09/12/2022    2:05 PM  Depression screen PHQ 2/9  Decreased Interest 1 1  Down, Depressed, Hopeless 1 1  PHQ - 2 Score 2 2  Altered sleeping 1 1  Tired, decreased energy 1 1  Change in appetite 1 1  Feeling bad or failure about yourself  1 0  Trouble concentrating 1 1  Moving slowly or fidgety/restless 0 0  Suicidal thoughts 0 0  PHQ-9 Score 7 6  Difficult doing work/chores Not difficult at all        10/11/2022    9:40 AM 09/12/2022    2:05 PM  GAD 7 : Generalized Anxiety Score  Nervous, Anxious, on Edge 1 1  Control/stop worrying 2 1  Worry too much - different things 2 1  Trouble relaxing 1 1  Restless 1 1  Easily annoyed or irritable 1 1  Afraid - awful might happen 1 0  Total GAD 7 Score 9 6  Anxiety Difficulty Not difficult at all       Upstream - 10/11/22 5916       Pregnancy Intention Screening   Does the patient want to become pregnant in the next year? N/A    Does the patient's partner want to become pregnant in the next year? N/A    Would the patient like to discuss contraceptive options today? N/A      Contraception Wrap Up   Current Method Female Sterilization   hyst   End  Method Female Sterilization   hyst   Contraception Counseling Provided No             Assessment:     1. Night sweats Not as bad  2. Vaginal dryness better  3. Dyspareunia in female Still has some pain but a little better   4. Moody Still moody  5. S/P hysterectomy with oophorectomy  6. Current use of estrogen therapy Will increase Estrace to 2 mg 1 daily Meds ordered this encounter  Medications   estradiol (ESTRACE) 2 MG tablet    Sig: Take 1 tablet (2 mg total) by mouth daily.    Dispense:  30 tablet    Refill:  6    Order Specific Question:   Supervising Provider    Answer:   Florian Buff [[id number]]       Plan:      Try nutrafol for hair thinning Follow up in 6 weeks for ROS

## 2022-10-26 ENCOUNTER — Other Ambulatory Visit (HOSPITAL_COMMUNITY): Payer: Self-pay | Admitting: Family Medicine

## 2022-10-26 DIAGNOSIS — Z1231 Encounter for screening mammogram for malignant neoplasm of breast: Secondary | ICD-10-CM

## 2022-10-30 ENCOUNTER — Inpatient Hospital Stay (HOSPITAL_COMMUNITY): Admission: RE | Admit: 2022-10-30 | Payer: Medicaid Other | Source: Ambulatory Visit

## 2022-10-30 DIAGNOSIS — Z1231 Encounter for screening mammogram for malignant neoplasm of breast: Secondary | ICD-10-CM

## 2022-11-01 ENCOUNTER — Ambulatory Visit (HOSPITAL_COMMUNITY): Payer: Medicaid Other

## 2022-11-22 ENCOUNTER — Ambulatory Visit: Payer: Medicaid Other | Admitting: Adult Health

## 2022-11-27 ENCOUNTER — Ambulatory Visit: Payer: Medicaid Other | Admitting: Adult Health

## 2022-12-01 ENCOUNTER — Encounter: Payer: Self-pay | Admitting: Adult Health

## 2022-12-01 ENCOUNTER — Ambulatory Visit (INDEPENDENT_AMBULATORY_CARE_PROVIDER_SITE_OTHER): Payer: Medicaid Other | Admitting: Adult Health

## 2022-12-01 VITALS — BP 121/84 | HR 90 | Ht 66.0 in | Wt 149.0 lb

## 2022-12-01 DIAGNOSIS — Z90721 Acquired absence of ovaries, unilateral: Secondary | ICD-10-CM

## 2022-12-01 DIAGNOSIS — Z79899 Other long term (current) drug therapy: Secondary | ICD-10-CM

## 2022-12-01 DIAGNOSIS — Z90710 Acquired absence of both cervix and uterus: Secondary | ICD-10-CM | POA: Diagnosis not present

## 2022-12-01 DIAGNOSIS — N3946 Mixed incontinence: Secondary | ICD-10-CM

## 2022-12-01 MED ORDER — MIRABEGRON ER 25 MG PO TB24: 25.0000 mg | ORAL_TABLET | Freq: Every day | ORAL | 0 refills | Status: DC

## 2022-12-01 NOTE — Progress Notes (Addendum)
Subjective:     Patient ID: Lacey Woodard, female   DOB: Oct 30, 1970, 52 y.o.   MRN: [id number]  HPI Lacey Woodard is a 52 year old white female, divorced, sp hysterectomy back in follow up on taking estrace 2 mg 1 daily and is good, wants to continue. She is having some urinary incontinence if coughs, lifts heavy, or has to go, wonders if bladder dropped. She is a Child psychotherapist.   PCP is Dr Sudie Bailey.   Review of Systems Still moody but night sweats and vaginal dryness better +UI Reviewed past medical,surgical, social and family history. Reviewed medications and allergies.     Objective:   Physical Exam BP 121/84 (BP Location: Left Arm, Patient Position: Sitting, Cuff Size: Normal)   Pulse 90   Ht 5\' 6"  (1.676 m)   Wt 149 lb (67.6 kg)   BMI 24.05 kg/m     Skin warm and dry.Pelvic: external genitalia is normal in appearance no lesions, vagina: pale pink, no cystocele noted, urethra has no lesions or masses noted, cervix and uterus are absent,adnexa: no masses or tenderness noted. Bladder is non tender and no masses felt. Fall risk is low    12/01/2022    8:57 AM 10/11/2022    9:39 AM 09/12/2022    2:05 PM  Depression screen PHQ 2/9  Decreased Interest 1 1 1   Down, Depressed, Hopeless 1 1 1   PHQ - 2 Score 2 2 2   Altered sleeping 1 1 1   Tired, decreased energy 1 1 1   Change in appetite 1 1 1   Feeling bad or failure about yourself  1 1 0  Trouble concentrating 1 1 1   Moving slowly or fidgety/restless 1 0 0  Suicidal thoughts 0 0 0  PHQ-9 Score 8 7 6   Difficult doing work/chores Not difficult at all Not difficult at all    On Effexor     12/01/2022    8:59 AM 10/11/2022    9:40 AM 09/12/2022    2:05 PM  GAD 7 : Generalized Anxiety Score  Nervous, Anxious, on Edge 1 1 1   Control/stop worrying 1 2 1   Worry too much - different things 1 2 1   Trouble relaxing 2 1 1   Restless 2 1 1   Easily annoyed or irritable 1 1 1   Afraid - awful might happen 1 1 0  Total GAD 7 Score 9 9 6   Anxiety  Difficulty  Not difficult at all       Upstream - 12/01/22 0857       Pregnancy Intention Screening   Does the patient want to become pregnant in the next year? N/A    Does the patient's partner want to become pregnant in the next year? N/A    Would the patient like to discuss contraceptive options today? N/A      Contraception Wrap Up   Current Method Female Sterilization   hyst   End Method Female Sterilization   hyst   Contraception Counseling Provided No             Examination chaperoned by Malachy Mood LPN.  Assessment:     1. Current use of estrogen therapy Continue estrace 2 mg 1 daily has refills   2. S/P hysterectomy with oophorectomy  3. Mixed stress and urge urinary incontinence Given 14 tabs of myrbetriq  25 mg 1 daily to try, let me know if helps or not, if not will refer to urology    Plan:     Follow  up in 3 months for ROS

## 2022-12-11 ENCOUNTER — Other Ambulatory Visit: Payer: Self-pay | Admitting: Adult Health

## 2022-12-11 MED ORDER — MIRABEGRON ER 25 MG PO TB24: 25.0000 mg | ORAL_TABLET | Freq: Every day | ORAL | 6 refills | Status: DC

## 2022-12-11 NOTE — Progress Notes (Signed)
Will refill myrbetriq 

## 2022-12-15 ENCOUNTER — Telehealth: Payer: Self-pay | Admitting: *Deleted

## 2022-12-15 NOTE — Telephone Encounter (Signed)
1 sample box of Myrbetriq 25 mg given to pt. Lot # [id number] exp 11/2024. Pt to pick up at front desk. JSY

## 2022-12-19 ENCOUNTER — Telehealth: Payer: Self-pay | Admitting: Adult Health

## 2022-12-19 ENCOUNTER — Other Ambulatory Visit: Payer: Self-pay | Admitting: Adult Health

## 2022-12-19 MED ORDER — OXYBUTYNIN CHLORIDE ER 10 MG PO TB24: 10.0000 mg | ORAL_TABLET | Freq: Every day | ORAL | 2 refills | Status: DC

## 2022-12-19 NOTE — Progress Notes (Signed)
Will rx oxybutynin, myrbetriq not covered

## 2022-12-19 NOTE — Telephone Encounter (Signed)
Left message that insurance will not cover myrbetriq.Marland Kitchen is it working or not? Will need to try another medication, just let me know

## 2022-12-24 ENCOUNTER — Encounter: Payer: Self-pay | Admitting: Emergency Medicine

## 2022-12-24 ENCOUNTER — Other Ambulatory Visit: Payer: Self-pay

## 2022-12-24 ENCOUNTER — Ambulatory Visit (INDEPENDENT_AMBULATORY_CARE_PROVIDER_SITE_OTHER): Payer: Medicaid Other

## 2022-12-24 ENCOUNTER — Ambulatory Visit
Admission: EM | Admit: 2022-12-24 | Discharge: 2022-12-24 | Disposition: A | Discharge status: Home / Self Care | Payer: Medicaid Other | Attending: Nurse Practitioner | Admitting: Nurse Practitioner

## 2022-12-24 DIAGNOSIS — R918 Other nonspecific abnormal finding of lung field: Secondary | ICD-10-CM | POA: Diagnosis not present

## 2022-12-24 DIAGNOSIS — J439 Emphysema, unspecified: Secondary | ICD-10-CM

## 2022-12-24 MED ORDER — PREDNISONE 20 MG PO TABS: 40.0000 mg | ORAL_TABLET | Freq: Every day | ORAL | 0 refills | Status: AC

## 2022-12-24 MED ORDER — AMOXICILLIN-POT CLAVULANATE 875-125 MG PO TABS: 1.0000 | ORAL_TABLET | Freq: Two times a day (BID) | ORAL | 0 refills | Status: DC

## 2022-12-24 MED ORDER — ALBUTEROL SULFATE HFA 108 (90 BASE) MCG/ACT IN AERS: 2.0000 | INHALATION_SPRAY | Freq: Four times a day (QID) | RESPIRATORY_TRACT | 0 refills | Status: DC | PRN

## 2022-12-24 NOTE — ED Provider Notes (Signed)
[facility]    CSN: [id number] Arrival date & time: 12/24/22  1358      History   Chief Complaint Chief Complaint  Patient presents with   Generalized Body Aches    Entered by patient    HPI Lacey Woodard is a 52 y.o. female.   The history is provided by the patient.   The patient presents for complaints of shortness of breath, and chest tightness that started today.  Patient states that symptoms started when she was at work.  She states that she also has nasal congestion, and a headache that has been present for 1 day.  Patient denies fever, chills, ear pain, ear drainage, cough, abdominal pain, nausea, vomiting, or diarrhea.  Patient states the pain is in the middle of her chest.  She describes it as a "tightness".  She states that the pain does not radiate, but she states that she does have pain that has been present in the left arm and left breast that is not new.  Patient reports that she does smoke currently, and has a history of seasonal allergies.  Patient denies any obvious known sick contacts.  She reports that she did take allergy medicine for her symptoms.  Past Medical History:  Diagnosis Date   Anxiety     Patient Active Problem List   Diagnosis Date Noted   Mixed stress and urge urinary incontinence 12/01/2022   Current use of estrogen therapy 10/11/2022   Hair thinning 09/12/2022   Moody 09/12/2022   S/P hysterectomy with oophorectomy 09/12/2022   Dyspareunia in female 09/12/2022   Vaginal dryness 09/12/2022   Night sweats 09/12/2022    Past Surgical History:  Procedure Laterality Date   ABDOMINAL HYSTERECTOMY     bilateral inguinal hernia repair     COLONOSCOPY WITH PROPOFOL N/A 08/08/2022   Procedure: COLONOSCOPY WITH PROPOFOL;  Surgeon: Lanelle Bal, DO;  Location: [facility];  Service: Endoscopy;  Laterality: N/A;  1:00pm, asa 2    OB History     Gravida  2   Para  2   Term  2   Preterm      AB      Living  2       SAB      IAB      Ectopic      Multiple      Live Births               Home Medications    Prior to Admission medications   Medication Sig Start Date End Date Taking? Authorizing Provider  albuterol (VENTOLIN HFA) 108 (90 Base) MCG/ACT inhaler Inhale 2 puffs into the lungs every 6 (six) hours as needed for wheezing or shortness of breath. 12/24/22  Yes Idella Lamontagne-Warren, Sadie Haber, NP  amoxicillin-clavulanate (AUGMENTIN) 875-125 MG tablet Take 1 tablet by mouth every 12 (twelve) hours. 12/24/22  Yes Maliek Schellhorn-Warren, Sadie Haber, NP  oxybutynin (DITROPAN XL) 10 MG 24 hr tablet Take 1 tablet (10 mg total) by mouth daily. 12/19/22 12/19/23  Adline Potter, NP  predniSONE (DELTASONE) 20 MG tablet Take 2 tablets (40 mg total) by mouth daily with breakfast for 5 days. 12/24/22 12/29/22 Yes Lydiah Pong-Warren, Sadie Haber, NP  ALPRAZolam Prudy Feeler) 0.5 MG tablet Take 0.5 mg by mouth at bedtime as needed for sleep. 10/25/15   [provider]  Biotin 5000 MCG TABS Take 5,000 mcg by mouth daily.    [provider]  Cholecalciferol (VITAMIN D3) 25  MCG (1000 UT) CAPS Take 1,000 Units by mouth daily.    [provider]  estradiol (ESTRACE) 2 MG tablet Take 1 tablet (2 mg total) by mouth daily. 10/11/22   Adline Potter, NP  magnesium oxide (MAG-OX) 400 (240 Mg) MG tablet Take 400 mg by mouth at bedtime.    [provider]  venlafaxine XR (EFFEXOR-XR) 150 MG 24 hr capsule Take 150 mg by mouth in the morning and at bedtime. 09/27/15   [provider]    Family History Family History  Problem Relation Age of Onset   Colon cancer Maternal Aunt     Social History Social History   Tobacco Use   Smoking status: Every Day    Packs/day: 1    Types: Cigarettes   Smokeless tobacco: Never  Vaping Use   Vaping Use: Never used  Substance Use Topics   Alcohol use: No   Drug use: No     Allergies   Patient has no known allergies.   Review of  Systems Review of Systems   Physical Exam Triage Vital Signs ED Triage Vitals  Enc Vitals Group     BP 12/24/22 1406 102/68     Pulse Rate 12/24/22 1406 83     Resp 12/24/22 1406 18     Temp 12/24/22 1406 98.5 F (36.9 C)     Temp Source 12/24/22 1406 Oral     SpO2 12/24/22 1406 93 %     Weight --      Height --      Head Circumference --      Peak Flow --      Pain Score 12/24/22 1408 2     Pain Loc --      Pain Edu? --      Excl. in GC? --    No data found.  Updated Vital Signs BP 102/68 (BP Location: Right Arm)   Pulse 83   Temp 98.5 F (36.9 C) (Oral)   Resp 18   SpO2 93%   Visual Acuity Right Eye Distance:   Left Eye Distance:   Bilateral Distance:    Right Eye Near:   Left Eye Near:    Bilateral Near:     Physical Exam Vitals and nursing note reviewed.  Constitutional:      General: She is not in acute distress.    Appearance: Normal appearance.  HENT:     Head: Normocephalic.     Right Ear: Tympanic membrane, ear canal and external ear normal.     Left Ear: Tympanic membrane, ear canal and external ear normal.     Nose: Congestion and rhinorrhea present.     Right Turbinates: Enlarged and swollen.     Left Turbinates: Enlarged and swollen.     Right Sinus: No maxillary sinus tenderness or frontal sinus tenderness.     Left Sinus: No maxillary sinus tenderness or frontal sinus tenderness.     Mouth/Throat:     Lips: Pink.     Mouth: Mucous membranes are moist.     Pharynx: Oropharynx is clear. Uvula midline. Posterior oropharyngeal erythema present. No pharyngeal swelling, oropharyngeal exudate or uvula swelling.     Comments: Cobblestoning present on posterior oropharynx Eyes:     Extraocular Movements: Extraocular movements intact.     Conjunctiva/sclera: Conjunctivae normal.     Pupils: Pupils are equal, round, and reactive to light.  Cardiovascular:     Rate and Rhythm: Normal rate and regular rhythm.  Pulses: Normal pulses.     Heart  sounds: Normal heart sounds.  Pulmonary:     Effort: Pulmonary effort is normal. No respiratory distress.     Breath sounds: Normal breath sounds. No stridor. No wheezing, rhonchi or rales.  Abdominal:     General: Bowel sounds are normal.     Palpations: Abdomen is soft.     Tenderness: There is no abdominal tenderness.  Musculoskeletal:     Cervical back: Normal range of motion.  Lymphadenopathy:     Cervical: No cervical adenopathy.  Skin:    General: Skin is warm and dry.  Neurological:     General: No focal deficit present.     Mental Status: She is alert and oriented to person, place, and time.  Psychiatric:        Mood and Affect: Mood normal.        Behavior: Behavior normal.      UC Treatments / Results  Labs (all labs ordered are listed, but only abnormal results are displayed) Labs Reviewed - No data to display  EKG: Normal sinus rhythm, no ectopy   Radiology DG Chest 2 View  Result Date: 12/24/2022 CLINICAL DATA:  Shortness of breath. EXAM: CHEST - 2 VIEW COMPARISON:  Chest x-ray November 01, 2011 FINDINGS: Apparent bulla in the right upper lobe, new in the interval. Streaky opacity in the right middle lobe and lingula. The heart, hila, mediastinum, lungs, and pleura are otherwise unremarkable. IMPRESSION: 1. Apparent bulla in the right upper lobe, new in the interval. 2. Streaky opacity in the right middle lobe and lingula may represent atelectasis or infiltrate/pneumonia. Recommend clinical correlation and attention on follow-up. 3. No other acute abnormalities. Electronically Signed   By: Gerome Sam III M.D.   On: 12/24/2022 14:52    Procedures Procedures (including critical care time)  Medications Ordered in UC Medications - No data to display  Initial Impression / Assessment and Plan / UC Course  I have reviewed the triage vital signs and the nursing notes.  Pertinent labs & imaging results that were available during my care of the patient were reviewed  by me and considered in my medical decision making (see chart for details).  The patient is well-appearing, she is in no acute distress, vital signs are stable.  EKG shows normal sinus rhythm with no ectopy.  Patient's chest x-ray with abnormal findings.  Chest x-ray shows a lung bulla in the right lower lobe, and opacities in the right middle lobe.  Will treat patient for possible pneumonia given her recent chest x-ray.  Patient was treated with Augmentin 875/125 mg tablets for possible pneumonia.  For her shortness of breath and chest tightness, patient was prescribed an albuterol inhaler and prednisone 40 mg for the next 5 days.  Discussion with patient regarding the findings of her chest x-ray and the possible cause, recommended smoking cessation.  Patient was given supportive care recommendations to include increasing fluids, allowing for plenty of rest, and over-the-counter analgesics for pain or discomfort.  Patient was advised that if she experiences worsening chest tightness, shortness of breath, difficulty breathing, would like for her to follow-up in the emergency department immediately for further evaluation.  Patient was given information for the local physician in this area to establish care.  Patient is in agreement with this plan of care and verbalizes understanding.  All questions were answered.  Patient stable for discharge.   Final Clinical Impressions(s) / UC Diagnoses   Final diagnoses:  Opacity of lung on imaging study  Lung bullae [facility])     Discharge Instructions      Your chest x-ray shows that you may have pneumonia in your right middle lung.  It also shows something called a bulla in the right upper lobe of your lung, that most likely has been caused by smoking. Take medication as prescribed. Increase fluids and allow for plenty of rest. May take over-the-counter Tylenol for pain, fever or general discomfort. I would like for you to consider smoking cessation.  If you  experience worsening shortness of breath, difficulty breathing, or chest pain, I would like for you to go to the emergency department immediately for further evaluation. We are providing you information to establish care with a PCP in this area.  Please call their offices soon as possible to schedule an appointment.      ED Prescriptions     Medication Sig Dispense Auth. Provider   amoxicillin-clavulanate (AUGMENTIN) 875-125 MG tablet Take 1 tablet by mouth every 12 (twelve) hours. 14 tablet Chondra Boyde-Warren, Sadie Haber, NP   albuterol (VENTOLIN HFA) 108 (90 Base) MCG/ACT inhaler Inhale 2 puffs into the lungs every 6 (six) hours as needed for wheezing or shortness of breath. 8 g Macoy Rodwell-Warren, Sadie Haber, NP   predniSONE (DELTASONE) 20 MG tablet Take 2 tablets (40 mg total) by mouth daily with breakfast for 5 days. 10 tablet Ellianna Ruest-Warren, Sadie Haber, NP      PDMP not reviewed this encounter.   Abran Cantor, NP 12/24/22 1513

## 2022-12-24 NOTE — Discharge Instructions (Addendum)
Your chest x-ray shows that you may have pneumonia in your right middle lung.  It also shows something called a bulla in the right upper lobe of your lung, that most likely has been caused by smoking. Take medication as prescribed. Increase fluids and allow for plenty of rest. May take over-the-counter Tylenol for pain, fever or general discomfort. I would like for you to consider smoking cessation.  If you experience worsening shortness of breath, difficulty breathing, or chest pain, I would like for you to go to the emergency department immediately for further evaluation. We are providing you information to establish care with a PCP in this area.  Please call their offices soon as possible to schedule an appointment.

## 2022-12-24 NOTE — ED Triage Notes (Signed)
States SOB today and felt a tightness in her chest that started today.  States she  has nasal congestion and a headache since yesterday

## 2023-01-01 ENCOUNTER — Inpatient Hospital Stay (HOSPITAL_COMMUNITY): Admission: RE | Admit: 2023-01-01 | Payer: Medicaid Other | Source: Ambulatory Visit

## 2023-01-01 ENCOUNTER — Other Ambulatory Visit (HOSPITAL_COMMUNITY): Payer: Self-pay | Admitting: Adult Health

## 2023-01-01 DIAGNOSIS — N6310 Unspecified lump in the right breast, unspecified quadrant: Secondary | ICD-10-CM

## 2023-01-01 DIAGNOSIS — Z1231 Encounter for screening mammogram for malignant neoplasm of breast: Secondary | ICD-10-CM

## 2023-01-02 ENCOUNTER — Ambulatory Visit (HOSPITAL_COMMUNITY)
Admission: RE | Admit: 2023-01-02 | Discharge: 2023-01-02 | Disposition: A | Discharge status: Home / Self Care | Payer: Medicaid Other | Source: Ambulatory Visit | Attending: Adult Health | Admitting: Adult Health

## 2023-01-02 DIAGNOSIS — N6310 Unspecified lump in the right breast, unspecified quadrant: Secondary | ICD-10-CM | POA: Diagnosis not present

## 2023-02-28 ENCOUNTER — Ambulatory Visit (INDEPENDENT_AMBULATORY_CARE_PROVIDER_SITE_OTHER): Payer: Medicaid Other | Admitting: Adult Health

## 2023-02-28 ENCOUNTER — Encounter: Payer: Self-pay | Admitting: Adult Health

## 2023-02-28 VITALS — BP 126/79 | HR 81 | Ht 66.0 in | Wt 142.0 lb

## 2023-02-28 DIAGNOSIS — Z90710 Acquired absence of both cervix and uterus: Secondary | ICD-10-CM | POA: Diagnosis not present

## 2023-02-28 DIAGNOSIS — N3946 Mixed incontinence: Secondary | ICD-10-CM

## 2023-02-28 DIAGNOSIS — Z90721 Acquired absence of ovaries, unilateral: Secondary | ICD-10-CM

## 2023-02-28 DIAGNOSIS — Z79899 Other long term (current) drug therapy: Secondary | ICD-10-CM

## 2023-02-28 DIAGNOSIS — R61 Generalized hyperhidrosis: Secondary | ICD-10-CM | POA: Diagnosis not present

## 2023-02-28 DIAGNOSIS — R4589 Other symptoms and signs involving emotional state: Secondary | ICD-10-CM

## 2023-02-28 MED ORDER — OXYBUTYNIN CHLORIDE ER 10 MG PO TB24: 10.0000 mg | ORAL_TABLET | Freq: Every day | ORAL | 6 refills | Status: DC

## 2023-02-28 NOTE — Progress Notes (Signed)
  Subjective:     Patient ID: Lacey Woodard, female   DOB: 01-31-1971, 52 y.o.   MRN: [id number]  HPI Lacey Woodard is a 52 year old white female, divorced, sp hysterectomy back in follow up on taking ditropan and has decreased estrace from 2 mg to 1 mg, felt too wet, had discharge.  Night sweats good, still moody. Urine leakage may be a little better.  Needs PCP, gave name of Dr Durwin Nora.  Review of Systems Night sweats good still moody.  Urine leakage may be a little better.   Reviewed past medical,surgical, social and family history. Reviewed medications and allergies.  Objective:   Physical Exam BP 126/79 (BP Location: Left Arm, Patient Position: Sitting, Cuff Size: Normal)   Pulse 81   Ht 5\' 6"  (1.676 m)   Wt 142 lb (64.4 kg)   BMI 22.92 kg/m  Skin warm and dry.  Lungs: clear to ausculation bilaterally. Cardiovascular: regular rate and rhythm.      Upstream - 02/28/23 1130       Pregnancy Intention Screening   Does the patient want to become pregnant in the next year? N/A    Does the patient's partner want to become pregnant in the next year? N/A    Would the patient like to discuss contraceptive options today? N/A      Contraception Wrap Up   Current Method Female Sterilization   hyst   End Method Female Sterilization   hyst   Contraception Counseling Provided No             Assessment:     1. Current use of estrogen therapy Continue taking estrace 1 mg, has refills   2. S/P hysterectomy with oophorectomy   3. Mixed stress and urge urinary incontinence A little better Continue ditropan Meds ordered this encounter  Medications   oxybutynin (DITROPAN XL) 10 MG 24 hr tablet    Sig: Take 1 tablet (10 mg total) by mouth daily.    Dispense:  30 tablet    Refill:  6    Order Specific Question:   Supervising Provider    Answer:   Despina Hidden, LUTHER H [[id number]]     4. Night sweats Much better   5. Moody On effexor      Plan:     Follow up in 6 months

## 2023-03-02 ENCOUNTER — Ambulatory Visit: Payer: Medicaid Other | Admitting: Adult Health

## 2023-03-29 ENCOUNTER — Ambulatory Visit: Payer: Medicaid Other | Admitting: Orthopedic Surgery

## 2023-03-29 ENCOUNTER — Other Ambulatory Visit: Payer: Self-pay | Admitting: *Deleted

## 2023-03-29 MED ORDER — ESTRADIOL 2 MG PO TABS: 2.0000 mg | ORAL_TABLET | Freq: Every day | ORAL | 6 refills | Status: DC

## 2023-04-05 ENCOUNTER — Ambulatory Visit (INDEPENDENT_AMBULATORY_CARE_PROVIDER_SITE_OTHER): Payer: Medicaid Other | Admitting: Orthopedic Surgery

## 2023-04-05 ENCOUNTER — Encounter: Payer: Self-pay | Admitting: Orthopedic Surgery

## 2023-04-05 ENCOUNTER — Other Ambulatory Visit: Payer: Self-pay

## 2023-04-05 DIAGNOSIS — M5010 Cervical disc disorder with radiculopathy, unspecified cervical region: Secondary | ICD-10-CM

## 2023-04-05 DIAGNOSIS — M542 Cervicalgia: Secondary | ICD-10-CM

## 2023-04-05 MED ORDER — METHOCARBAMOL 500 MG PO TABS: 500.0000 mg | ORAL_TABLET | Freq: Three times a day (TID) | ORAL | 0 refills | Status: DC | PRN

## 2023-04-05 NOTE — Progress Notes (Addendum)
Office Visit Note   Patient: Lacey Woodard           Date of Birth: 02-13-1971           MRN: [id number] Visit Date: 04/05/2023              Requested by: Gareth Morgan, MD [address] [place],  [place] [address] PCP: Gareth Morgan, MD  Chief Complaint  Patient presents with   Other     Neck/arm pain      HPI: Patient is a 52 year old woman who is seen for evaluation for cervical radicular pain involving the right upper extremity.  Patient states the pain radiates along the medial scapular border with tingling into her fingers.  She has taken prednisone without relief she has taken Flexeril without relief.  She has been on ibuprofen as well.  She has tried massage therapy and chiropractic manipulation.  Symptoms for over 3 months.  No known injury.  Assessment & Plan: Visit Diagnoses:  1. Neck pain   2. Cervical disc disorder with radiculopathy, unspecified cervical region     Plan: Order placed for an MRI scan of her cervical spine will follow-up after the MRI is obtained.  Prescription provided for Robaxin.  Follow-Up Instructions: No follow-ups on file.   Ortho Exam  Patient is alert, oriented, no adenopathy, well-dressed, normal affect, normal respiratory effort. Examination patient has decreased range of motion of her cervical spine secondary to muscle spasm.  She has tenderness along the medial scapular border.  Motor groups are 5/5 and symmetric of both upper extremities.  Subjective tingling into the right hand.  Imaging: No results found. No images are attached to the encounter.  Labs: Lab Results  Component Value Date   REPTSTATUS 03/23/2010 FINAL 03/22/2010   CULT NO GROWTH 03/22/2010     Lab Results  Component Value Date   ALBUMIN 3.8 08/17/2013    No results found for: "MG" No results found for: "VD25OH"  No results found for: "PREALBUMIN"    Latest Ref Rng & Units 08/17/2013   11:30 PM 03/24/2011    1:30 AM 03/22/2010   11:09 AM   CBC EXTENDED  WBC 4.0 - 10.5 K/uL 9.5   19.3   RBC 3.87 - 5.11 MIL/uL 4.41   4.94   Hemoglobin 12.0 - 15.0 g/dL 56.2  13.0  86.5   HCT 36.0 - 46.0 % 41.3  41.0  47.1   Platelets 150 - 400 K/uL 250   217   NEUT# 1.7 - 7.7 K/uL 4.5   16.3   Lymph# 0.7 - 4.0 K/uL 4.3   2.2      There is no height or weight on file to calculate BMI.  Orders:  Orders Placed This Encounter  Procedures   XR Cervical Spine 2 or 3 views   MR Cervical Spine w/o contrast   Meds ordered this encounter  Medications   methocarbamol (ROBAXIN) 500 MG tablet    Sig: Take 1 tablet (500 mg total) by mouth every 8 (eight) hours as needed for muscle spasms.    Dispense:  30 tablet    Refill:  0     Procedures: No procedures performed  Clinical Data: No additional findings.  ROS:  All other systems negative, except as noted in the HPI. Review of Systems  Objective: Vital Signs: There were no vitals taken for this visit.  Specialty Comments:  No specialty comments available.  PMFS History: Patient Active Problem List  Diagnosis Date Noted   Mixed stress and urge urinary incontinence 12/01/2022   Current use of estrogen therapy 10/11/2022   Hair thinning 09/12/2022   Moody 09/12/2022   S/P hysterectomy with oophorectomy 09/12/2022   Dyspareunia in female 09/12/2022   Vaginal dryness 09/12/2022   Night sweats 09/12/2022   Past Medical History:  Diagnosis Date   Anxiety     Family History  Problem Relation Age of Onset   Colon cancer Maternal Aunt     Past Surgical History:  Procedure Laterality Date   ABDOMINAL HYSTERECTOMY     bilateral inguinal hernia repair     COLONOSCOPY WITH PROPOFOL N/A 08/08/2022   Procedure: COLONOSCOPY WITH PROPOFOL;  Surgeon: Lanelle Bal, DO;  [facility]: [facility];  Service: Endoscopy;  Laterality: N/A;  1:00pm, asa 2   Social History   Occupational History   Not on file  Tobacco Use   Smoking status: Every Day    Current packs/day: 1.00     Types: Cigarettes   Smokeless tobacco: Never  Vaping Use   Vaping status: Never Used  Substance and Sexual Activity   Alcohol use: No   Drug use: No   Sexual activity: Yes    Birth control/protection: Surgical, Post-menopausal    Comment: hyst

## 2023-04-09 ENCOUNTER — Ambulatory Visit: Payer: Medicaid Other | Admitting: Orthopedic Surgery

## 2023-04-19 ENCOUNTER — Ambulatory Visit: Payer: Medicaid Other | Admitting: Family Medicine

## 2023-04-19 ENCOUNTER — Other Ambulatory Visit: Payer: Self-pay

## 2023-04-19 DIAGNOSIS — M5010 Cervical disc disorder with radiculopathy, unspecified cervical region: Secondary | ICD-10-CM

## 2023-04-19 DIAGNOSIS — M542 Cervicalgia: Secondary | ICD-10-CM

## 2023-04-23 ENCOUNTER — Ambulatory Visit: Payer: Medicaid Other | Admitting: Family Medicine

## 2023-04-26 ENCOUNTER — Ambulatory Visit (HOSPITAL_COMMUNITY): Payer: Medicaid Other

## 2023-05-02 ENCOUNTER — Ambulatory Visit (HOSPITAL_COMMUNITY): Payer: Medicaid Other | Attending: Orthopedic Surgery

## 2023-05-02 ENCOUNTER — Other Ambulatory Visit: Payer: Self-pay

## 2023-05-02 DIAGNOSIS — M7541 Impingement syndrome of right shoulder: Secondary | ICD-10-CM | POA: Diagnosis present

## 2023-05-02 DIAGNOSIS — M542 Cervicalgia: Secondary | ICD-10-CM | POA: Insufficient documentation

## 2023-05-02 DIAGNOSIS — G8929 Other chronic pain: Secondary | ICD-10-CM | POA: Insufficient documentation

## 2023-05-02 DIAGNOSIS — M25511 Pain in right shoulder: Secondary | ICD-10-CM | POA: Diagnosis present

## 2023-05-02 DIAGNOSIS — M5010 Cervical disc disorder with radiculopathy, unspecified cervical region: Secondary | ICD-10-CM | POA: Insufficient documentation

## 2023-05-02 NOTE — Therapy (Addendum)
[facility] OUT[facility]IENT PHYSICAL THERAPY EVALUATION (THORACOLUMBAR)   Patient Name: Lacey Woodard MRN: [id number] DOB:1971/04/15, 52 y.o., female Today's Date: 05/02/2023  END OF SESSION:   PT End of Session - 05/02/23 0903     Visit Number 1    Number of Visits 8    Date for PT Re-Evaluation 06/13/23    Authorization Type [place] Medicaid Well Care    Authorization Time Period 8 visits Requested on 05/02/23    PT Start Time 0900    PT Stop Time 0940    PT Time Calculation (min) 40 min              Past Medical History:  Diagnosis Date   Anxiety    Past Surgical History:  Procedure Laterality Date   ABDOMINAL HYSTERECTOMY     bilateral inguinal hernia repair     COLONOSCOPY WITH PROPOFOL N/A 08/08/2022   Procedure: COLONOSCOPY WITH PROPOFOL;  Surgeon: Lanelle Bal, DO;  Location: [facility];  Service: Endoscopy;  Laterality: N/A;  1:00pm, asa 2   Patient Active Problem List   Diagnosis Date Noted   Mixed stress and urge urinary incontinence 12/01/2022   Current use of estrogen therapy 10/11/2022   Hair thinning 09/12/2022   Moody 09/12/2022   S/P hysterectomy with oophorectomy 09/12/2022   Dyspareunia in female 09/12/2022   Vaginal dryness 09/12/2022   Night sweats 09/12/2022    PCP: Lacey Woodard, MDRef Provider (PCP)   REFERRING PROVIDER: Nadara Mustard, MD   REFERRING DIAG:  M54.2 (ICD-10-CM) - Neck pain  M50.10 (ICD-10-CM) - Cervical disc disorder with radiculopathy, unspecified cervical region    Rationale for Evaluation and Treatment: Rehabilitation  THERAPY DIAG:  Cervical pain  Shoulder impingement syndrome, right  Chronic right shoulder pain  ONSET DATE: over 3 months --------------------------------------------------------------------------------------------- SUBJECTIVE:                                                                                                                                                                                            SUBJECTIVE STATEMENT: Patient states right neck pain began with insidious onset. Patient states she began to go to the Chiropractor for a few visits with no change in symptoms. Patient returned to MD who wants to perform an MRI after attempting PT.    PERTINENT HISTORY:   N/a  PAIN:  Are you having pain? Yes: NPRS scale: 42/10 Pain location: right neck down into right UE Pain description: sharp, tingling  Aggravating factors: ADLs Relieving factors: rest  PRECAUTIONS: None  RED FLAGS: None   WEIGHT BEARING RESTRICTIONS: No   LIVING ENVIRONMENT: Lives with: lives alone  Lives in: House/apartment Stairs: No Has following equipment at home: None  OCCUPATION: Waitress; out of work for 2 weeks   PLOF: Independent  PATIENT GOALS: To return to [facility]  NEXT MD VISIT: TBD --------------------------------------------------------------------------------------------- OBJECTIVE:   DIAGNOSTIC FINDINGS:  MRI pending   PATIENT SURVEYS:  Quick Dash QuickDASH Score: 50.0 / 100 = 50.0 %  SCREENING FOR RED FLAGS: Bowel or bladder incontinence: No Spinal tumors: No Cauda equina syndrome: No Compression fracture: No Abdominal aneurysm: No  COGNITION: Overall cognitive status: Within functional limits for tasks assessed  POSTURE: rounded shoulders and increased lumbar lordosis      FUNCTIONAL TESTS:  Grip left 60#  right 60#    SENSATION: WFL   CERVICAL ROM:   AROM eval  Flexion WFL*  Extension WFL  Right lateral flexion WFL  Left lateral flexion WFL  Right rotation 50%*  Left rotation    (Blank rows = not tested; * = limited by pain)  Upper EXTREMITY MMT:    Right UE grossly 4-/5 except: right shoulder abduction/ ER 3+/5 with pain  UPPER EXTREMITY ROM:       Right UE WFL except: Functional IR on right shoulder 50% available ROM with pain  UE Special Test:  + right lift off test- Suprapsinatus  PALPATION: MOD TTP right cervical, RTC ms   --------------------------------------------------------------------------------------------- TODAY'S TREATMENT:                                                                                                                              DATE:   05/02/23 PT EVAL- Low Complexity  Therapeutic exercise x4min -Qurdruped rock -left SLER with red theraband   PATIENT EDUCATION:  Education details: HEP, pain management Person educated: Patient Education method: Explanation and Demonstration Education comprehension: verbalized understanding and returned demonstration  HOME EXERCISE PROGRAM: Access Code: [email or web address] URL: [email or web address] Date: 05/02/2023 Prepared by: Seymour Bars  Exercises - Quadruped Rocking Slow  - 2 x daily - 7 x weekly - 10-15 reps - 3" hold - Sidelying Shoulder ER with Towel and Dumbbell  - 2 x daily - 7 x weekly - 10-15 reps - 3" hold --------------------------------------------------------------------------------------------- ASSESSMENT:  CLINICAL IMPRESSION: Patient is a 52 y.o. y.o. female who was seen today for physical therapy evaluation and treatment for right shoulder/ neck pain with sy/sx of right shoulder impingement . Patient presents to PT with the following objective impairments: decreased activity tolerance, decreased ROM, decreased strength, postural dysfunction, and pain. These impairments limit the patient in activities such as carrying, lifting, sleeping, reach over head, and hygiene/grooming. These impairments also limit the patient in participation such as meal prep, cleaning, laundry, and occupation. The patient will benefit from PT to address the limitations/impairments listed below to return to their prior level of function in the domains of activity and participation.    PERSONAL FACTORS:  n/a  are also affecting patient's functional outcome.   REHAB POTENTIAL: Good  CLINICAL DECISION MAKING:  Stable/uncomplicated  EVALUATION COMPLEXITY: Low  --------------------------------------------------------------------------------------------- GOALS: Goals reviewed with patient? Yes  SHORT TERM GOALS: Target date: 05/30/2023    Patient will be able to improve bilateral Grip Strength by 5# to facilitate lifting objects, self-care, and ADL completion  Baseline: Goal status: INITIAL  2.  Patient will score a </= 42 on the  QuickDASH  to demonstrate an improvement in ADL completion, household/community, and self-care Baseline:  Goal status: INITIAL  3. Patient will be independent with a basic stretching/strengthening HEP  Baseline:  Goal status: INITIAL   LONG TERM GOALS: Target date: 06/27/2023    Patient will be able to improve bilateral Grip Strength by 10# to facilitate lifting objects, self-care, and ADL completion  Baseline:  Goal status: INITIAL  2.  Patient will score a </= 35 on the QuickDASH to demonstrate an improvement in ADL completion, household/community, and self-care Baseline:  Goal status: INITIAL  3.  Patient will be independent with a comprehensive strengthening HEP  Baseline:  Goal status: INITIAL  -------------------------------------------------------------------------------------------------------------------------------------- PLAN:  PT FREQUENCY: 1-2x/week  PT DURATION: 8 weeks  PLANNED INTERVENTIONS: Therapeutic exercises, Therapeutic activity, Neuromuscular re-education, Balance training, Gait training, Patient/Family education, Self Care, Joint mobilization, Joint manipulation, Dry Needling, Spinal manipulation, Spinal mobilization, and Manual therapy.  PLAN FOR NEXT SESSION: Initiate shoulder strength exercise routine for right shoulder impingement    Seymour Bars, PT 05/02/2023, 10:30 AM

## 2023-05-04 ENCOUNTER — Telehealth (HOSPITAL_COMMUNITY): Payer: Self-pay

## 2023-05-04 ENCOUNTER — Ambulatory Visit (HOSPITAL_COMMUNITY): Payer: Medicaid Other

## 2023-05-04 NOTE — Telephone Encounter (Signed)
No show #1, called and left message concerning missed apt today. Reminded next apt date and time wiht contact information included as well. Also included no show policy details in this message.   Becky Sax, LPTA/CLT; [facility] [phone]

## 2023-05-07 ENCOUNTER — Encounter (HOSPITAL_COMMUNITY): Payer: Medicaid Other

## 2023-05-09 ENCOUNTER — Encounter (HOSPITAL_COMMUNITY): Payer: Medicaid Other

## 2023-05-14 ENCOUNTER — Ambulatory Visit (HOSPITAL_COMMUNITY): Payer: Medicaid Other

## 2023-05-14 DIAGNOSIS — M7541 Impingement syndrome of right shoulder: Secondary | ICD-10-CM

## 2023-05-14 DIAGNOSIS — M542 Cervicalgia: Secondary | ICD-10-CM

## 2023-05-14 DIAGNOSIS — G8929 Other chronic pain: Secondary | ICD-10-CM

## 2023-05-14 NOTE — Therapy (Addendum)
[facility] PHYS[facility]ERAPY TREATMENT   Patient Name: Lacey Woodard MRN: [id number] DOB:12/19/1970, 52 y.o., female Today's Date: 05/14/2023  END OF SESSION:   PT End of Session - 05/14/23 0851     Visit Number 2    Number of Visits 8    Date for PT Re-Evaluation 06/13/23    Authorization Type [place] [facility] Medicaid Well Care    Authorization Time Period 10 approved until 07/01/23    Authorization - Visit Number 2    Authorization - Number of Visits 10    Progress Note Due on Visit 10    PT Start Time 0845    PT Stop Time 0925    PT Time Calculation (min) 40 min    Activity Tolerance Patient tolerated treatment well               Past Medical History:  Diagnosis Date   Anxiety    Past Surgical History:  Procedure Laterality Date   ABDOMINAL HYSTERECTOMY     bilateral inguinal hernia repair     COLONOSCOPY WITH PROPOFOL N/A 08/08/2022   Procedure: COLONOSCOPY WITH PROPOFOL;  Surgeon: Lanelle Bal, DO;  [facility];  Service: Endoscopy;  Laterality: N/A;  1:00pm, asa 2   Patient Active Problem List   Diagnosis Date Noted   Mixed stress and urge urinary incontinence 12/01/2022   Current use of estrogen therapy 10/11/2022   Hair thinning 09/12/2022   Moody 09/12/2022   S/P hysterectomy with oophorectomy 09/12/2022   Dyspareunia in female 09/12/2022   Vaginal dryness 09/12/2022   Night sweats 09/12/2022    PCP: Gareth Morgan, MDRef Provider (PCP)   REFERRING PROVIDER: Nadara Mustard, MD   REFERRING DIAG:  M54.2 (ICD-10-CM) - Neck pain  M50.10 (ICD-10-CM) - Cervical disc disorder with radiculopathy, unspecified cervical region    Rationale for Evaluation and Treatment: Rehabilitation  THERAPY DIAG:  No diagnosis found.  ONSET DATE: over 3 months --------------------------------------------------------------------------------------------- SUBJECTIVE:                                                                                                                                                                                            SUBJECTIVE STATEMENT: Today: Pt states the right UE pain decreased but she has not attempted her HEP.   IE: Patient states right neck pain began with insidious onset. Patient states she began to go to the Chiropractor for a few visits with no change in symptoms. Patient returned to MD who wants to perform an MRI after attempting PT.    PERTINENT HISTORY:   N/a  PAIN:  Are you having pain? Yes:  NPRS scale: 42/10 Pain location: right neck down into right UE Pain description: sharp, tingling  Aggravating factors: ADLs Relieving factors: rest  PRECAUTIONS: None  RED FLAGS: None   WEIGHT BEARING RESTRICTIONS: No   LIVING ENVIRONMENT: Lives with: lives alone Lives in: House/apartment Stairs: No Has following equipment at home: None  OCCUPATION: Waitress; out of work for 2 weeks   PLOF: Independent  PATIENT GOALS: To return to [name]  NEXT MD VISIT: TBD --------------------------------------------------------------------------------------------- OBJECTIVE:   DIAGNOSTIC FINDINGS:  MRI pending   PATIENT SURVEYS:  Quick Dash QuickDASH Score: 50.0 / 100 = 50.0 %  SCREENING FOR RED FLAGS: Bowel or bladder incontinence: No Spinal tumors: No Cauda equina syndrome: No Compression fracture: No Abdominal aneurysm: No  COGNITION: Overall cognitive status: Within functional limits for tasks assessed  POSTURE: rounded shoulders and increased lumbar lordosis      FUNCTIONAL TESTS:  Grip left 60#  right 60#    SENSATION: WFL   CERVICAL ROM:   AROM eval  Flexion WFL*  Extension WFL  Right lateral flexion WFL  Left lateral flexion WFL  Right rotation 50%*  Left rotation    (Blank rows = not tested; * = limited by pain)  Upper EXTREMITY MMT:    Right UE grossly 4-/5 except: right shoulder abduction/ ER 3+/5 with pain  UPPER EXTREMITY ROM:       Right UE WFL except:  Functional IR on right shoulder 50% available ROM with pain   UE Special Test:  + right lift off test- Supraspinatus  + right median nerve tension   PALPATION: MOD TTP right cervical, RTC ms  --------------------------------------------------------------------------------------------- TODAY'S TREATMENT:                                                                                                                              DATE:  05/14/23  Manual therapy x25 -soft tissue mobilization to right RTC muscles, latissimus -gentle median N glides to right UE x8   Therapeutic exercise x15' -Supine stick flexion with 2# 2x10 -left sidelying ER with 2# 2x10 -UBE Retro 2'    05/02/23 PT EVAL- Low Complexity Therapeutic exercise x70min -Quadruped rock -left SLER with red theraband   PATIENT EDUCATION:  Education details: HEP, pain management Person educated: Patient Education method: Explanation and Demonstration Education comprehension: verbalized understanding and returned demonstration  HOME EXERCISE PROGRAM: Access Code: [email or web address] URL: [email or web address] Date: 05/02/2023 Prepared by: Seymour Bars  Exercises - Quadruped Rocking Slow  - 2 x daily - 7 x weekly - 10-15 reps - 3" hold - Sidelying Shoulder ER with Towel and Dumbbell  - 2 x daily - 7 x weekly - 10-15 reps - 3" hold --------------------------------------------------------------------------------------------- ASSESSMENT:  CLINICAL IMPRESSION: Today: PT evaluated for more right RTC/ nerve involvement. PT to find + right median nerve test during manual therapy; soft tissue mobilization was used to decrease right RTC muscle guarding. Session then continued with basic right UE RTC strengthening. PT advised  pt to attempt HEP.    IE: Patient is a 52 y.o. y.o. female who was seen today for physical therapy evaluation and treatment for right shoulder/ neck pain with sy/sx of right shoulder impingement .  Patient presents to PT with the following objective impairments: decreased activity tolerance, decreased ROM, decreased strength, postural dysfunction, and pain. These impairments limit the patient in activities such as carrying, lifting, sleeping, reach over head, and hygiene/grooming. These impairments also limit the patient in participation such as meal prep, cleaning, laundry, and occupation. The patient will benefit from PT to address the limitations/impairments listed below to return to their prior level of function in the domains of activity and participation.    PERSONAL FACTORS:  n/a  are also affecting patient's functional outcome.   REHAB POTENTIAL: Good  CLINICAL DECISION MAKING: Stable/uncomplicated  EVALUATION COMPLEXITY: Low  --------------------------------------------------------------------------------------------- GOALS: Goals reviewed with patient? Yes  SHORT TERM GOALS: Target date: 05/30/2023    Patient will be able to improve bilateral Grip Strength by 5# to facilitate lifting objects, self-care, and ADL completion  Baseline: Goal status: INITIAL  2.  Patient will score a </= 42 on the  QuickDASH  to demonstrate an improvement in ADL completion, household/community, and self-care Baseline:  Goal status: INITIAL  3. Patient will be independent with a basic stretching/strengthening HEP  Baseline:  Goal status: INITIAL   LONG TERM GOALS: Target date: 06/27/2023    Patient will be able to improve bilateral Grip Strength by 10# to facilitate lifting objects, self-care, and ADL completion  Baseline:  Goal status: INITIAL  2.  Patient will score a </= 35 on the QuickDASH to demonstrate an improvement in ADL completion, household/community, and self-care Baseline:  Goal status: INITIAL  3.  Patient will be independent with a comprehensive strengthening HEP  Baseline:  Goal status:  INITIAL  -------------------------------------------------------------------------------------------------------------------------------------- PLAN:  PT FREQUENCY: 1-2x/week  PT DURATION: 8 weeks  PLANNED INTERVENTIONS: Therapeutic exercises, Therapeutic activity, Neuromuscular re-education, Balance training, Gait training, Patient/Family education, Self Care, Joint mobilization, Spinal manipulation, Spinal mobilization, and Manual therapy.  PLAN FOR NEXT SESSION: Initiate shoulder strength exercise routine for right shoulder impingement    Seymour Bars, PT 05/14/2023, 8:56 AM

## 2023-05-18 ENCOUNTER — Ambulatory Visit: Payer: Medicaid Other | Admitting: Family Medicine

## 2023-05-21 ENCOUNTER — Ambulatory Visit (HOSPITAL_COMMUNITY): Payer: Medicaid Other

## 2023-05-21 DIAGNOSIS — M542 Cervicalgia: Secondary | ICD-10-CM

## 2023-05-21 DIAGNOSIS — M7541 Impingement syndrome of right shoulder: Secondary | ICD-10-CM

## 2023-05-21 DIAGNOSIS — G8929 Other chronic pain: Secondary | ICD-10-CM

## 2023-05-21 NOTE — Therapy (Signed)
OUTPATIENT PHYSICAL THERAPY TREATMENT   Patient Name: Lacey Woodard MRN: [id number] DOB:Apr 17, 1971, 52 y.o., female Today's Date: 05/21/2023  END OF SESSION:   PT End of Session - 05/21/23 0914     Visit Number 3    Number of Visits 8    Date for PT Re-Evaluation 06/13/23    Authorization Type [place] [employer]    Authorization Time Period 10 approved until 07/01/23    Authorization - Number of Visits 10    Progress Note Due on Visit 10    Activity Tolerance Patient tolerated treatment well                Past Medical History:  Diagnosis Date   Anxiety    Past Surgical History:  Procedure Laterality Date   ABDOMINAL HYSTERECTOMY     bilateral inguinal hernia repair     COLONOSCOPY WITH PROPOFOL N/A 08/08/2022   Procedure: COLONOSCOPY WITH PROPOFOL;  Surgeon: Lanelle Bal, DO;  Location: [facility];  Service: Endoscopy;  Laterality: N/A;  1:00pm, asa 2   Patient Active Problem List   Diagnosis Date Noted   Mixed stress and urge urinary incontinence 12/01/2022   Current use of estrogen therapy 10/11/2022   Hair thinning 09/12/2022   Moody 09/12/2022   S/P hysterectomy with oophorectomy 09/12/2022   Dyspareunia in female 09/12/2022   Vaginal dryness 09/12/2022   Night sweats 09/12/2022    PCP: Gareth Morgan, MDRef Provider (PCP)   REFERRING PROVIDER: Nadara Mustard, MD   REFERRING DIAG:  M54.2 (ICD-10-CM) - Neck pain  M50.10 (ICD-10-CM) - Cervical disc disorder with radiculopathy, unspecified cervical region    Rationale for Evaluation and Treatment: Rehabilitation  THERAPY DIAG:  No diagnosis found.  ONSET DATE: over 3 months --------------------------------------------------------------------------------------------- SUBJECTIVE:                                                                                                                                                                                           SUBJECTIVE  STATEMENT: Today: Patient with no change in tingling symptoms down the right UE    IE: Patient states right neck pain began with insidious onset. Patient states she began to go to the Chiropractor for a few visits with no change in symptoms. Patient returned to MD who wants to perform an MRI after attempting PT.    PERTINENT HISTORY:   N/a  PAIN:  Are you having pain? Yes: NPRS scale: 2-4/10 Pain location: right neck down into right UE Pain description: sharp, tingling  Aggravating factors: ADLs Relieving factors: rest  PRECAUTIONS: None  RED FLAGS: None   WEIGHT BEARING  RESTRICTIONS: No   LIVING ENVIRONMENT: Lives with: lives alone Lives in: House/apartment Stairs: No Has following equipment at home: None  OCCUPATION: Waitress; out of work for 2 weeks   PLOF: Independent  PATIENT GOALS: To return to [name]  NEXT MD VISIT: TBD --------------------------------------------------------------------------------------------- OBJECTIVE:   DIAGNOSTIC FINDINGS:  MRI pending   PATIENT SURVEYS:  Quick Dash QuickDASH Score: 50.0 / 100 = 50.0 %  SCREENING FOR RED FLAGS: Bowel or bladder incontinence: No Spinal tumors: No Cauda equina syndrome: No Compression fracture: No Abdominal aneurysm: No  COGNITION: Overall cognitive status: Within functional limits for tasks assessed  POSTURE: rounded shoulders and increased lumbar lordosis      FUNCTIONAL TESTS:  Grip left 60#  right 60#    SENSATION: WFL   CERVICAL ROM:   AROM eval  Flexion WFL*  Extension WFL  Right lateral flexion WFL  Left lateral flexion WFL  Right rotation 50%*  Left rotation    (Blank rows = not tested; * = limited by pain)  Upper EXTREMITY MMT:    Right UE grossly 4-/5 except: right shoulder abduction/ ER 3+/5 with pain  UPPER EXTREMITY ROM:       Right UE WFL except: Functional IR on right shoulder 50% available ROM with pain   UE Special Test:  + right lift off test-  Supraspinatus  + right median nerve tension   PALPATION: MOD TTP right cervical, RTC ms  --------------------------------------------------------------------------------------------- TODAY'S TREATMENT:                                                                                                                              DATE:   05/21/23 Manual therapy x15 -soft tissue mobilization to right cervical, RTC, lats, suubscapularis Therapeutic exercise x25' -Supine isometrics to right shoulder  -Standing IR/ER with right theraband   05/14/23  Manual therapy x25 -soft tissue mobilization to right RTC muscles, latissimus -gentle median N glides to right UE x8  Therapeutic exercise x15' -Supine stick flexion with 2# 2x10 -left sidelying ER with 2# 2x10 -UBE Retro 2'    05/02/23 PT EVAL- Low Complexity Therapeutic exercise x40min -Quadruped rock -left SLER with red theraband   PATIENT EDUCATION:  Education details: HEP, pain management Person educated: Patient Education method: Explanation and Demonstration Education comprehension: verbalized understanding and returned demonstration  HOME EXERCISE PROGRAM: Access Code: [email or web address] URL: [email or web address] Date: 05/02/2023 Prepared by: Seymour Bars  Exercises - Quadruped Rocking Slow  - 2 x daily - 7 x weekly - 10-15 reps - 3" hold - Sidelying Shoulder ER with Towel and Dumbbell  - 2 x daily - 7 x weekly - 10-15 reps - 3" hold --------------------------------------------------------------------------------------------- ASSESSMENT:  CLINICAL IMPRESSION: Today: PT evaluated for more right RTC/ nerve involvement. Soft tissue mobilization was used to decrease right RTC muscle guarding. Session then continued with basic right UE RTC strengthening. PT advised pt to attempt HEP.    IE: Patient is a 52 y.o. y.o. female  who was seen today for physical therapy evaluation and treatment for right shoulder/ neck pain with  sy/sx of right shoulder impingement . Patient presents to PT with the following objective impairments: decreased activity tolerance, decreased ROM, decreased strength, postural dysfunction, and pain. These impairments limit the patient in activities such as carrying, lifting, sleeping, reach over head, and hygiene/grooming. These impairments also limit the patient in participation such as meal prep, cleaning, laundry, and occupation. The patient will benefit from PT to address the limitations/impairments listed below to return to their prior level of function in the domains of activity and participation.    PERSONAL FACTORS:  n/a  are also affecting patient's functional outcome.   REHAB POTENTIAL: Good  CLINICAL DECISION MAKING: Stable/uncomplicated  EVALUATION COMPLEXITY: Low  --------------------------------------------------------------------------------------------- GOALS: Goals reviewed with patient? Yes  SHORT TERM GOALS: Target date: 05/30/2023    Patient will be able to improve bilateral Grip Strength by 5# to facilitate lifting objects, self-care, and ADL completion  Baseline: Goal status: INITIAL  2.  Patient will score a </= 42 on the  QuickDASH  to demonstrate an improvement in ADL completion, household/community, and self-care Baseline:  Goal status: INITIAL  3. Patient will be independent with a basic stretching/strengthening HEP  Baseline:  Goal status: INITIAL   LONG TERM GOALS: Target date: 06/27/2023    Patient will be able to improve bilateral Grip Strength by 10# to facilitate lifting objects, self-care, and ADL completion  Baseline:  Goal status: INITIAL  2.  Patient will score a </= 35 on the QuickDASH to demonstrate an improvement in ADL completion, household/community, and self-care Baseline:  Goal status: INITIAL  3.  Patient will be independent with a comprehensive strengthening HEP  Baseline:  Goal status:  INITIAL  -------------------------------------------------------------------------------------------------------------------------------------- PLAN:  PT FREQUENCY: 1-2x/week  PT DURATION: 8 weeks  PLANNED INTERVENTIONS: Therapeutic exercises, Therapeutic activity, Neuromuscular re-education, Balance training, Gait training, Patient/Family education, Self Care, Joint mobilization, Joint manipulation, Spinal manipulation, Spinal mobilization, and Manual therapy.  PLAN FOR NEXT SESSION: Initiate shoulder strength exercise routine for right shoulder impingement    Seymour Bars, PT 05/21/2023, 9:45 AM

## 2023-05-21 NOTE — Addendum Note (Signed)
Addended by: Seymour Bars on: 05/21/2023 09:41 AM   Modules accepted: Orders

## 2023-05-25 ENCOUNTER — Ambulatory Visit: Payer: Medicaid Other | Admitting: Family Medicine

## 2023-05-28 ENCOUNTER — Ambulatory Visit (HOSPITAL_COMMUNITY): Payer: Medicaid Other

## 2023-05-28 DIAGNOSIS — M7541 Impingement syndrome of right shoulder: Secondary | ICD-10-CM

## 2023-05-28 DIAGNOSIS — M542 Cervicalgia: Secondary | ICD-10-CM

## 2023-05-28 DIAGNOSIS — G8929 Other chronic pain: Secondary | ICD-10-CM

## 2023-05-28 NOTE — Therapy (Signed)
OUTPATIENT PHYSICAL THERAPY TREATMENT   Patient Name: Lacey Woodard MRN: [id number] DOB:12-04-1970, 52 y.o., female Today's Date: 05/28/2023  END OF SESSION:   PT End of Session - 05/28/23 0849     Visit Number 4    Number of Visits 8    Date for PT Re-Evaluation 06/13/23    Authorization Type [place] [employer]    Authorization Time Period 10 approved until 07/01/23    Authorization - Number of Visits 10    Progress Note Due on Visit 10    PT Start Time 0845    PT Stop Time 0925    PT Time Calculation (min) 40 min    Activity Tolerance Patient tolerated treatment well                Past Medical History:  Diagnosis Date   Anxiety    Past Surgical History:  Procedure Laterality Date   ABDOMINAL HYSTERECTOMY     bilateral inguinal hernia repair     COLONOSCOPY WITH PROPOFOL N/A 08/08/2022   Procedure: COLONOSCOPY WITH PROPOFOL;  Surgeon: Lanelle Bal, DO;  Location: [facility];  Service: Endoscopy;  Laterality: N/A;  1:00pm, asa 2   Patient Active Problem List   Diagnosis Date Noted   Mixed stress and urge urinary incontinence 12/01/2022   Current use of estrogen therapy 10/11/2022   Hair thinning 09/12/2022   Moody 09/12/2022   S/P hysterectomy with oophorectomy 09/12/2022   Dyspareunia in female 09/12/2022   Vaginal dryness 09/12/2022   Night sweats 09/12/2022    PCP: Gareth Morgan, MDRef Provider (PCP)   REFERRING PROVIDER: Nadara Mustard, MD   REFERRING DIAG:  M54.2 (ICD-10-CM) - Neck pain  M50.10 (ICD-10-CM) - Cervical disc disorder with radiculopathy, unspecified cervical region    Rationale for Evaluation and Treatment: Rehabilitation  THERAPY DIAG:  No diagnosis found.  ONSET DATE: over 3 months --------------------------------------------------------------------------------------------- SUBJECTIVE:                                                                                                                                                                                            SUBJECTIVE STATEMENT: Today: Patient states she has seen no improvement in tingling symptoms. Has attempted HEP. Reports sleeping < 4 hours a night   IE: Patient states right neck pain began with insidious onset. Patient states she began to go to the Chiropractor for a few visits with no change in symptoms. Patient returned to MD who wants to perform an MRI after attempting PT.    PERTINENT HISTORY:   N/a  PAIN:  Are you having pain? Yes: NPRS scale:  2-4/10 Pain location: right neck down into right UE Pain description: sharp, tingling  Aggravating factors: ADLs Relieving factors: rest  PRECAUTIONS: None  RED FLAGS: None   WEIGHT BEARING RESTRICTIONS: No   LIVING ENVIRONMENT: Lives with: lives alone Lives in: House/apartment Stairs: No Has following equipment at home: None  OCCUPATION: Waitress; out of work for 2 weeks   PLOF: Independent  PATIENT GOALS: To return to [name]  NEXT MD VISIT: TBD --------------------------------------------------------------------------------------------- OBJECTIVE:   DIAGNOSTIC FINDINGS:  MRI pending   PATIENT SURVEYS:  Quick Dash QuickDASH Score: 50.0 / 100 = 50.0 %  SCREENING FOR RED FLAGS: Bowel or bladder incontinence: No Spinal tumors: No Cauda equina syndrome: No Compression fracture: No Abdominal aneurysm: No  COGNITION: Overall cognitive status: Within functional limits for tasks assessed  POSTURE: rounded shoulders and increased lumbar lordosis      FUNCTIONAL TESTS:  Grip left 60#  right 60#    SENSATION: WFL   CERVICAL ROM:   AROM eval  Flexion WFL*  Extension WFL  Right lateral flexion WFL  Left lateral flexion WFL  Right rotation 50%*  Left rotation    (Blank rows = not tested; * = limited by pain)  Upper EXTREMITY MMT:    Right UE grossly 4-/5 except: right shoulder abduction/ ER 3+/5 with pain  UPPER EXTREMITY ROM:       Right UE  WFL except: Functional IR on right shoulder 50% available ROM with pain   UE Special Test:  + right lift off test- Supraspinatus  + right median nerve tension   PALPATION: MOD TTP right cervical, RTC ms  --------------------------------------------------------------------------------------------- TODAY'S TREATMENT:                                                                                                                              DATE:   05/28/23 Manual therapy x38' -Supine soft tissue mobilization to cervical spine -Supine cervical PROM -Supine manual stretch to cervical ms   05/21/23 Manual therapy x15 -soft tissue mobilization to right cervical, RTC, lats, suubscapularis Therapeutic exercise x25' -Supine isometrics to right shoulder  -Standing IR/ER with right theraband   05/14/23  Manual therapy x25 -soft tissue mobilization to right RTC muscles, latissimus -gentle median N glides to right UE x8  Therapeutic exercise x15' -Supine stick flexion with 2# 2x10 -left sidelying ER with 2# 2x10 -UBE Retro 2'    PATIENT EDUCATION:  Education details: HEP, pain management Person educated: Patient Education method: Explanation and Demonstration Education comprehension: verbalized understanding and returned demonstration  HOME EXERCISE PROGRAM: Access Code: [email or web address] URL: [email or web address] Date: 05/02/2023 Prepared by: Seymour Bars  Exercises - Quadruped Rocking Slow  - 2 x daily - 7 x weekly - 10-15 reps - 3" hold - Sidelying Shoulder ER with Towel and Dumbbell  - 2 x daily - 7 x weekly - 10-15 reps - 3" hold --------------------------------------------------------------------------------------------- ASSESSMENT:  CLINICAL IMPRESSION: Today: PT session focused on manual therapy to help manage  pain down right UE. Cervical PROM with minimal limitations but patient presents with moderate muscle guarding/trigger point through cervical area. Patient  with minimal relief post manual therapy. PT to introduce more postural exercises once pain/tingling has decreased.    IE: Patient is a 52 y.o. y.o. female who was seen today for physical therapy evaluation and treatment for right shoulder/ neck pain with sy/sx of right shoulder impingement . Patient presents to PT with the following objective impairments: decreased activity tolerance, decreased ROM, decreased strength, postural dysfunction, and pain. These impairments limit the patient in activities such as carrying, lifting, sleeping, reach over head, and hygiene/grooming. These impairments also limit the patient in participation such as meal prep, cleaning, laundry, and occupation. The patient will benefit from PT to address the limitations/impairments listed below to return to their prior level of function in the domains of activity and participation.    PERSONAL FACTORS:  n/a  are also affecting patient's functional outcome.   REHAB POTENTIAL: Good  CLINICAL DECISION MAKING: Stable/uncomplicated  EVALUATION COMPLEXITY: Low  --------------------------------------------------------------------------------------------- GOALS: Goals reviewed with patient? Yes  SHORT TERM GOALS: Target date: 05/30/2023    Patient will be able to improve bilateral Grip Strength by 5# to facilitate lifting objects, self-care, and ADL completion  Baseline: Goal status: INITIAL  2.  Patient will score a </= 42 on the  QuickDASH  to demonstrate an improvement in ADL completion, household/community, and self-care Baseline:  Goal status: INITIAL  3. Patient will be independent with a basic stretching/strengthening HEP  Baseline:  Goal status: INITIAL   LONG TERM GOALS: Target date: 06/27/2023    Patient will be able to improve bilateral Grip Strength by 10# to facilitate lifting objects, self-care, and ADL completion  Baseline:  Goal status: INITIAL  2.  Patient will score a </= 35 on the QuickDASH  to demonstrate an improvement in ADL completion, household/community, and self-care Baseline:  Goal status: INITIAL  3.  Patient will be independent with a comprehensive strengthening HEP  Baseline:  Goal status: INITIAL  -------------------------------------------------------------------------------------------------------------------------------------- PLAN:  PT FREQUENCY: 1-2x/week  PT DURATION: 8 weeks  PLANNED INTERVENTIONS: Therapeutic exercises, Therapeutic activity, Neuromuscular re-education, Balance training, Gait training, Patient/Family education, Self Care, Joint mobilization, Joint manipulation, Spinal manipulation, Spinal mobilization, and Manual therapy.  PLAN FOR NEXT SESSION: Initiate shoulder strength exercise routine for right shoulder impingement    Seymour Bars, PT 05/28/2023, 8:56 AM

## 2023-05-30 ENCOUNTER — Ambulatory Visit: Payer: Medicaid Other | Admitting: Family Medicine

## 2023-06-04 ENCOUNTER — Ambulatory Visit (HOSPITAL_COMMUNITY): Payer: Medicaid Other | Attending: Orthopedic Surgery

## 2023-06-04 DIAGNOSIS — M7541 Impingement syndrome of right shoulder: Secondary | ICD-10-CM | POA: Insufficient documentation

## 2023-06-04 DIAGNOSIS — G8929 Other chronic pain: Secondary | ICD-10-CM | POA: Diagnosis present

## 2023-06-04 DIAGNOSIS — M25511 Pain in right shoulder: Secondary | ICD-10-CM | POA: Diagnosis present

## 2023-06-04 DIAGNOSIS — M542 Cervicalgia: Secondary | ICD-10-CM | POA: Insufficient documentation

## 2023-06-04 NOTE — Therapy (Signed)
OUTPATIENT PHYSICAL THERAPY TREATMENT   Patient Name: Lacey Woodard MRN: [id number] DOB:03-04-1971, 52 y.o., female Today's Date: 06/04/2023  END OF SESSION:   PT End of Session - 06/04/23 0848     Visit Number 5    Number of Visits 8    Date for PT Re-Evaluation 06/13/23    Authorization Type [place] Medicaid Well Care    Authorization Time Period 10 approved until 07/01/23    Authorization - Number of Visits 10    Progress Note Due on Visit 10    PT Start Time 0845    PT Stop Time 0925    PT Time Calculation (min) 40 min    Activity Tolerance Patient tolerated treatment well    Behavior During Therapy [facility] for tasks assessed/performed                Past Medical History:  Diagnosis Date   Anxiety    Past Surgical History:  Procedure Laterality Date   ABDOMINAL HYSTERECTOMY     bilateral inguinal hernia repair     COLONOSCOPY WITH PROPOFOL N/A 08/08/2022   Procedure: COLONOSCOPY WITH PROPOFOL;  Surgeon: Lacey Bal, DO;  [facility];  Service: Endoscopy;  Laterality: N/A;  1:00pm, asa 2   Patient Active Problem List   Diagnosis Date Noted   Mixed stress and urge urinary incontinence 12/01/2022   Current use of estrogen therapy 10/11/2022   Hair thinning 09/12/2022   Moody 09/12/2022   S/P hysterectomy with oophorectomy 09/12/2022   Dyspareunia in female 09/12/2022   Vaginal dryness 09/12/2022   Night sweats 09/12/2022    PCP: Lacey Woodard, MDRef Provider (PCP)   REFERRING PROVIDER: Nadara Mustard, MD   REFERRING DIAG:  M54.2 (ICD-10-CM) - Neck pain  M50.10 (ICD-10-CM) - Cervical disc disorder with radiculopathy, unspecified cervical region    Rationale for Evaluation and Treatment: Rehabilitation  THERAPY DIAG:  Shoulder impingement syndrome, right  Cervical pain  Chronic right shoulder pain  ONSET DATE: over 3 months --------------------------------------------------------------------------------------------- SUBJECTIVE:                                                                                                                                                                                            SUBJECTIVE STATEMENT: Today: Patient reports no changes in tingling since starting PT;  NPRS is a 3-4/10 today with tingling down right UE.    IE: Patient states right neck pain began with insidious onset. Patient states she began to go to the Chiropractor for a few visits with no change in symptoms. Patient returned to MD who wants to perform an MRI after  attempting PT.    PERTINENT HISTORY:   N/a  PAIN:  Are you having pain? Yes: NPRS scale: 2-4/10 Pain location: right neck down into right UE Pain description: sharp, tingling  Aggravating factors: ADLs Relieving factors: rest  PRECAUTIONS: None  RED FLAGS: None   WEIGHT BEARING RESTRICTIONS: No   LIVING ENVIRONMENT: Lives with: lives alone Lives in: House/apartment Stairs: No Has following equipment at home: None  OCCUPATION: Waitress; out of work for 2 weeks   PLOF: Independent  PATIENT GOALS: To return to Lacey Woodard  NEXT MD VISIT: TBD --------------------------------------------------------------------------------------------- OBJECTIVE:   DIAGNOSTIC FINDINGS:  MRI pending   PATIENT SURVEYS:  Quick Dash QuickDASH Score: 50.0 / 100 = 50.0 %  SCREENING FOR RED FLAGS: Bowel or bladder incontinence: No Spinal tumors: No Cauda equina syndrome: No Compression fracture: No Abdominal aneurysm: No  COGNITION: Overall cognitive status: Within functional limits for tasks assessed  POSTURE: rounded shoulders and increased lumbar lordosis      FUNCTIONAL TESTS:  Grip left 60#  right 60#    SENSATION: WFL   CERVICAL ROM:   AROM eval  Flexion WFL*  Extension WFL  Right lateral flexion WFL  Left lateral flexion WFL  Right rotation 50%*  Left rotation    (Blank rows = not tested; * = limited by pain)  Upper EXTREMITY MMT:     Right UE grossly 4-/5 except: right shoulder abduction/ ER 3+/5 with pain  UPPER EXTREMITY ROM:       Right UE WFL except: Functional IR on right shoulder 50% available ROM with pain   UE Special Test:  + right lift off test- Supraspinatus  + right median nerve tension   PALPATION: MOD TTP right cervical, RTC ms  --------------------------------------------------------------------------------------------- TODAY'S TREATMENT:                                                                                                                              DATE:  06/04/23  - Cat Cow  - 10 reps - 3-5 secs hold - Bird Dog  - 10 reps - 3-5secs hold - Shoulder External Rotation with Anchored Resistance  - 20 reps - 3-5 secs hold - Shoulder Internal Rotation with Resistance  - 20 reps - 3-5 secs  hold   05/28/23 Manual therapy x38' -Supine soft tissue mobilization to cervical spine -Supine cervical PROM -Supine manual stretch to cervical ms   05/21/23 Manual therapy x15 -soft tissue mobilization to right cervical, RTC, lats, suubscapularis Therapeutic exercise x25' -Supine isometrics to right shoulder  -Standing IR/ER with right theraband     PATIENT EDUCATION:  Education details: HEP, pain management Person educated: Patient Education method: Explanation and Demonstration Education comprehension: verbalized understanding and returned demonstration  HOME EXERCISE PROGRAM: Access Code: [email or web address] URL: [email or web address] Date: 06/04/2023 Prepared by: Lacey Woodard  Exercises - Cat Cow  - 10 reps - 3-5 secs hold - Bird Dog  - 10 reps - 3-5secs hold - Shoulder External  Rotation with Anchored Resistance  - 20 reps - 3-5 secs hold - Shoulder Internal Rotation with Resistance  - 20 reps - 3-5 secs  hold --------------------------------------------------------------------------------------------- ASSESSMENT:  CLINICAL IMPRESSION: Today: PT session focused on  postural re-ed to help manage pain down right UE. Patient taken through quadruped neuromuscular re-education with no increase in pain. PT recommends patient to follow-up with referring MD due to little change in symptoms after (5) visits of PT so far.  PT to introduce more postural exercises once pain/tingling has decreased.    IE: Patient is a 52 y.o. y.o. female who was seen today for physical therapy evaluation and treatment for right shoulder/ neck pain with sy/sx of right shoulder impingement . Patient presents to PT with the following objective impairments: decreased activity tolerance, decreased ROM, decreased strength, postural dysfunction, and pain. These impairments limit the patient in activities such as carrying, lifting, sleeping, reach over head, and hygiene/grooming. These impairments also limit the patient in participation such as meal prep, cleaning, laundry, and occupation. The patient will benefit from PT to address the limitations/impairments listed below to return to their prior level of function in the domains of activity and participation.    PERSONAL FACTORS:  n/a  are also affecting patient's functional outcome.   REHAB POTENTIAL: Good  CLINICAL DECISION MAKING: Stable/uncomplicated  EVALUATION COMPLEXITY: Low  --------------------------------------------------------------------------------------------- GOALS: Goals reviewed with patient? Yes  SHORT TERM GOALS: Target date: 05/30/2023    Patient will be able to improve bilateral Grip Strength by 5# to facilitate lifting objects, self-care, and ADL completion  Baseline: Goal status: INITIAL  2.  Patient will score a </= 42 on the  QuickDASH  to demonstrate an improvement in ADL completion, household/community, and self-care Baseline:  Goal status: INITIAL  3. Patient will be independent with a basic stretching/strengthening HEP  Baseline:  Goal status: INITIAL   LONG TERM GOALS: Target date: 06/27/2023     Patient will be able to improve bilateral Grip Strength by 10# to facilitate lifting objects, self-care, and ADL completion  Baseline:  Goal status: INITIAL  2.  Patient will score a </= 35 on the QuickDASH to demonstrate an improvement in ADL completion, household/community, and self-care Baseline:  Goal status: INITIAL  3.  Patient will be independent with a comprehensive strengthening HEP  Baseline:  Goal status: INITIAL  -------------------------------------------------------------------------------------------------------------------------------------- PLAN:  PT FREQUENCY: 1-2x/week  PT DURATION: 8 weeks  PLANNED INTERVENTIONS: Therapeutic exercises, Therapeutic activity, Neuromuscular re-education, Balance training, Gait training, Patient/Family education, Self Care, Joint mobilization, Joint manipulation, Spinal manipulation, Spinal mobilization, and Manual therapy.  PLAN FOR NEXT SESSION: Initiate shoulder strength exercise routine for right shoulder impingement    Lacey Woodard, PT 06/04/2023, 8:50 AM

## 2023-06-11 ENCOUNTER — Encounter (HOSPITAL_COMMUNITY): Payer: Medicaid Other

## 2023-06-12 ENCOUNTER — Ambulatory Visit (HOSPITAL_COMMUNITY): Payer: Medicaid Other

## 2023-06-12 DIAGNOSIS — M7541 Impingement syndrome of right shoulder: Secondary | ICD-10-CM | POA: Diagnosis not present

## 2023-06-12 DIAGNOSIS — G8929 Other chronic pain: Secondary | ICD-10-CM

## 2023-06-12 DIAGNOSIS — M542 Cervicalgia: Secondary | ICD-10-CM

## 2023-06-12 NOTE — Therapy (Signed)
OUTPATIENT PHYSICAL THERAPY TREATMENT   Patient Name: Lacey Woodard MRN: [id number] DOB:[birth year], 52 y.o., female Today's Date: 06/12/2023  END OF SESSION:   PT End of Session - 06/12/23 0932     Visit Number 6    Number of Visits 8    Date for PT Re-Evaluation 06/13/23    Authorization Type [place] Medicaid Well Care    Authorization Time Period 10 approved until 07/01/23    Authorization - Visit Number 6    Authorization - Number of Visits 10    Progress Note Due on Visit 10    PT Start Time 0930    PT Stop Time 1010    PT Time Calculation (min) 40 min    Activity Tolerance Patient tolerated treatment well    Behavior During Therapy [facility] for tasks assessed/performed                Past Medical History:  Diagnosis Date   Anxiety    Past Surgical History:  Procedure Laterality Date   ABDOMINAL HYSTERECTOMY     bilateral inguinal hernia repair     COLONOSCOPY WITH PROPOFOL N/A 08/08/2022   Procedure: COLONOSCOPY WITH PROPOFOL;  Surgeon: Lanelle Bal, DO;  [facility];  Service: Endoscopy;  Laterality: N/A;  1:00pm, asa 2   Patient Active Problem List   Diagnosis Date Noted   Mixed stress and urge urinary incontinence 12/01/2022   Current use of estrogen therapy 10/11/2022   Hair thinning 09/12/2022   Moody 09/12/2022   S/P hysterectomy with oophorectomy 09/12/2022   Dyspareunia in female 09/12/2022   Vaginal dryness 09/12/2022   Night sweats 09/12/2022    PCP: Gareth Morgan, MDRef Provider (PCP)   REFERRING PROVIDER: Nadara Mustard, MD   REFERRING DIAG:  M54.2 (ICD-10-CM) - Neck pain  M50.10 (ICD-10-CM) - Cervical disc disorder with radiculopathy, unspecified cervical region    Rationale for Evaluation and Treatment: Rehabilitation  THERAPY DIAG:  No diagnosis found.  ONSET DATE: over 3 months --------------------------------------------------------------------------------------------- SUBJECTIVE:                                                                                                                                                                                            SUBJECTIVE STATEMENT: Today: Patient reports no changes in symptoms; has her house being remodeled currently and has to be sleep in recliner.      NPRS is a 2-3/10 today with tingling down right UE.    IE: Patient states right neck pain began with insidious onset. Patient states she began to go to the Chiropractor for a few visits with no change  in symptoms. Patient returned to MD who wants to perform an MRI after attempting PT.    PERTINENT HISTORY:   N/a  PAIN:  Are you having pain? Yes: NPRS scale: 2-4/10 Pain location: right neck down into right UE Pain description: sharp, tingling  Aggravating factors: ADLs Relieving factors: rest  PRECAUTIONS: None  RED FLAGS: None   WEIGHT BEARING RESTRICTIONS: No   LIVING ENVIRONMENT: Lives with: lives alone Lives in: House/apartment Stairs: No Has following equipment at home: None  OCCUPATION: Waitress; out of work for 2 weeks   PLOF: Independent  PATIENT GOALS: To return to [name]  NEXT MD VISIT: TBD --------------------------------------------------------------------------------------------- OBJECTIVE:   DIAGNOSTIC FINDINGS:  MRI pending   PATIENT SURVEYS:  Quick Dash QuickDASH Score: 50.0 / 100 = 50.0 %  SCREENING FOR RED FLAGS: Bowel or bladder incontinence: No Spinal tumors: No Cauda equina syndrome: No Compression fracture: No Abdominal aneurysm: No  COGNITION: Overall cognitive status: Within functional limits for tasks assessed  POSTURE: rounded shoulders and increased lumbar lordosis      FUNCTIONAL TESTS:  Grip left 60#  right 60#   SENSATION: WFL   CERVICAL ROM:   AROM eval  Flexion WFL*  Extension WFL  Right lateral flexion WFL  Left lateral flexion WFL  Right rotation 50%*      (Blank rows = not tested; * = limited by pain)  Upper  EXTREMITY MMT:    Right UE grossly 4-/5 except: right shoulder abduction/ ER 3+/5 with pain  UPPER EXTREMITY ROM:       Right UE WFL except: Functional IR on right shoulder 50% available ROM with pain   UE Special Test:  + right lift off test- Supraspinatus  + right median nerve tension   PALPATION: MOD TTP right cervical, RTC ms  --------------------------------------------------------------------------------------------- TODAY'S TREATMENT:                                                                                                                              DATE:   06/12/23  Supine manual therapy to cervical muscles ( upper trap, scalenes, SCM, paraspinals) Gentle cervical PROM to end range as tolerated    06/04/23  Cat Cow  - 10 reps - 3-5 secs hold Bird Dog  - 10 reps - 3-5secs hold Shoulder External Rotation with Anchored Resistance  - 20 reps - 3-5 secs hold Shoulder Internal Rotation with Resistance  - 20 reps - 3-5 secs  hold   05/28/23 Manual therapy x38' -Supine soft tissue mobilization to cervical spine -Supine cervical PROM -Supine manual stretch to cervical ms   05/21/23 Manual therapy x15 -soft tissue mobilization to right cervical, RTC, lats, suubscapularis Therapeutic exercise x25' -Supine isometrics to right shoulder  -Standing IR/ER with right theraband     PATIENT EDUCATION:  Education details: HEP, pain management Person educated: Patient Education method: Explanation and Demonstration Education comprehension: verbalized understanding and returned demonstration  HOME EXERCISE PROGRAM: Access Code: [email or web address] URL:  [email or web address] Date: 06/04/2023 Prepared by: Seymour Bars  Exercises - Cat Cow  - 10 reps - 3-5 secs hold - Bird Dog  - 10 reps - 3-5secs hold - Shoulder External Rotation with Anchored Resistance  - 20 reps - 3-5 secs hold - Shoulder Internal Rotation with Resistance  - 20 reps - 3-5 secs   hold --------------------------------------------------------------------------------------------- ASSESSMENT:  CLINICAL IMPRESSION: Today: PT session focused manual therapy to cervical region because last session postural therapeutic exercise showed little improvement. Patient demonstrates symptoms that increased muscle tension/ pain can be a symptom of her current socioemotional status/stress. PT attempted to focus session on manual therapy to help decrease muscle guarding/ pain. Patient with minimal relief post session.  PT recommends patient to follow-up with referring MD within 2-3 visits due to no change in symptoms.   IE: Patient is a 52 y.o. y.o. female who was seen today for physical therapy evaluation and treatment for right shoulder/ neck pain with sy/sx of right shoulder impingement . Patient presents to PT with the following objective impairments: decreased activity tolerance, decreased ROM, decreased strength, postural dysfunction, and pain. These impairments limit the patient in activities such as carrying, lifting, sleeping, reach over head, and hygiene/grooming. These impairments also limit the patient in participation such as meal prep, cleaning, laundry, and occupation. The patient will benefit from PT to address the limitations/impairments listed below to return to their prior level of function in the domains of activity and participation.    PERSONAL FACTORS:  n/a  are also affecting patient's functional outcome.   REHAB POTENTIAL: Good  CLINICAL DECISION MAKING: Stable/uncomplicated  EVALUATION COMPLEXITY: Low  --------------------------------------------------------------------------------------------- GOALS: Goals reviewed with patient? Yes  SHORT TERM GOALS: Target date: 05/30/2023    Patient will be able to improve bilateral Grip Strength by 5# to facilitate lifting objects, self-care, and ADL completion  Baseline: Goal status: INITIAL  2.  Patient will score a  </= 42 on the  QuickDASH  to demonstrate an improvement in ADL completion, household/community, and self-care Baseline:  Goal status: INITIAL  3. Patient will be independent with a basic stretching/strengthening HEP  Baseline:  Goal status: INITIAL   LONG TERM GOALS: Target date: 06/27/2023    Patient will be able to improve bilateral Grip Strength by 10# to facilitate lifting objects, self-care, and ADL completion  Baseline:  Goal status: INITIAL  2.  Patient will score a </= 35 on the QuickDASH to demonstrate an improvement in ADL completion, household/community, and self-care Baseline:  Goal status: INITIAL  3.  Patient will be independent with a comprehensive strengthening HEP  Baseline:  Goal status: INITIAL  -------------------------------------------------------------------------------------------------------------------------------------- PLAN:  PT FREQUENCY: 1-2x/week  PT DURATION: 8 weeks  PLANNED INTERVENTIONS: Therapeutic exercises, Therapeutic activity, Neuromuscular re-education, Balance training, Gait training, Patient/Family education, Self Care, Joint mobilization, Joint manipulation, Spinal manipulation, Spinal mobilization, and Manual therapy.  PLAN FOR NEXT SESSION: Initiate shoulder strength exercise routine for right shoulder impingement    Seymour Bars, PT 06/12/2023, 3:54 PM

## 2023-06-18 ENCOUNTER — Ambulatory Visit: Payer: Medicaid Other | Admitting: Family Medicine

## 2023-06-24 NOTE — Progress Notes (Unsigned)
   New Patient Office Visit   Subjective   Patient ID: Lacey Woodard, female    DOB: 15-Jun-1971  Age: 52 y.o. MRN: [id number]  CC: No chief complaint on file.   HPI Lacey Woodard 52 year old female, presents to establish care. She  has a past medical history of Anxiety.  HPI    Outpatient Encounter Medications as of 06/25/2023  Medication Sig   albuterol (VENTOLIN HFA) 108 (90 Base) MCG/ACT inhaler Inhale 2 puffs into the lungs every 6 (six) hours as needed for wheezing or shortness of breath.   ALPRAZolam (XANAX) 0.5 MG tablet Take 0.5 mg by mouth at bedtime as needed for sleep.   Biotin 5000 MCG TABS Take 5,000 mcg by mouth daily.   Cholecalciferol (VITAMIN D3) 25 MCG (1000 UT) CAPS Take 1,000 Units by mouth daily.   cyclobenzaprine (FLEXERIL) 5 MG tablet SMARTSIG:1 Tablet(s) By Mouth 1-3 Times Daily   estradiol (ESTRACE) 2 MG tablet Take 1 tablet (2 mg total) by mouth daily.   fluticasone (FLONASE) 50 MCG/ACT nasal spray Place 2 sprays into both nostrils daily.   ibuprofen (ADVIL) 800 MG tablet SMARTSIG:1 Tablet(s) By Mouth 1-3 Times Daily   magnesium oxide (MAG-OX) 400 (240 Mg) MG tablet Take 400 mg by mouth at bedtime.   methocarbamol (ROBAXIN) 500 MG tablet Take 1 tablet (500 mg total) by mouth every 8 (eight) hours as needed for muscle spasms.   oxybutynin (DITROPAN XL) 10 MG 24 hr tablet Take 1 tablet (10 mg total) by mouth daily.   venlafaxine XR (EFFEXOR-XR) 150 MG 24 hr capsule Take 150 mg by mouth in the morning and at bedtime.   No facility-administered encounter medications on file as of 06/25/2023.    Past Surgical History:  Procedure Laterality Date   ABDOMINAL HYSTERECTOMY     bilateral inguinal hernia repair     COLONOSCOPY WITH PROPOFOL N/A 08/08/2022   Procedure: COLONOSCOPY WITH PROPOFOL;  Surgeon: Lanelle Bal, DO;  [facility];  Service: Endoscopy;  Laterality: N/A;  1:00pm, asa 2    ROS    Objective    There were no vitals  taken for this visit.  Physical Exam    Assessment & Plan:  There are no diagnoses linked to this encounter.  No follow-ups on file.   Cruzita Lederer Newman Nip, FNP

## 2023-06-24 NOTE — Patient Instructions (Signed)
        Great to see you today.  I have refilled the medication(s) we provide.   Effexor Tapering Schedule  Week 1: Take regular dose every other day. Week 2: Take regular dose every third day. Week 3: Take regular dose every fourth day. Week 4: Take regular dose every fifth day. Week 5: Discontinue Effexor completely.      Lung Cancer Screening: Call our Nurse Navigator at [phone]. Patients may also call themselves if they would like      Effexor (venlafaxine) can sometimes cause shaky hands or tremors as a side effect. This can happen due to its effects on neurotransmitters like serotonin and norepinephrine, which influence nerve and muscle function.     If labs were collected, we will inform you of lab results once received either by echart message or telephone call.   - echart message- for normal results that have been seen by the patient already.   - telephone call: abnormal results or if patient has not viewed results in their echart.   - Please take medications as prescribed. - Follow up with your primary health provider if any health concerns arises. - If symptoms worsen please contact your primary care provider and/or visit the emergency department.

## 2023-06-25 ENCOUNTER — Ambulatory Visit: Payer: Medicaid Other | Admitting: Family Medicine

## 2023-06-25 VITALS — BP 104/65 | HR 96 | Ht 66.0 in | Wt 141.1 lb

## 2023-06-25 DIAGNOSIS — Z1159 Encounter for screening for other viral diseases: Secondary | ICD-10-CM

## 2023-06-25 DIAGNOSIS — E559 Vitamin D deficiency, unspecified: Secondary | ICD-10-CM | POA: Diagnosis not present

## 2023-06-25 DIAGNOSIS — F419 Anxiety disorder, unspecified: Secondary | ICD-10-CM

## 2023-06-25 DIAGNOSIS — Z114 Encounter for screening for human immunodeficiency virus [HIV]: Secondary | ICD-10-CM

## 2023-06-25 DIAGNOSIS — R7301 Impaired fasting glucose: Secondary | ICD-10-CM | POA: Diagnosis not present

## 2023-06-25 DIAGNOSIS — Z136 Encounter for screening for cardiovascular disorders: Secondary | ICD-10-CM

## 2023-06-25 DIAGNOSIS — Z122 Encounter for screening for malignant neoplasm of respiratory organs: Secondary | ICD-10-CM

## 2023-06-25 DIAGNOSIS — F32A Depression, unspecified: Secondary | ICD-10-CM | POA: Insufficient documentation

## 2023-06-25 DIAGNOSIS — E538 Deficiency of other specified B group vitamins: Secondary | ICD-10-CM

## 2023-06-25 DIAGNOSIS — E038 Other specified hypothyroidism: Secondary | ICD-10-CM | POA: Diagnosis not present

## 2023-06-25 MED ORDER — ESCITALOPRAM OXALATE 10 MG PO TABS
10.0000 mg | ORAL_TABLET | Freq: Every day | ORAL | 2 refills | Status: DC
Start: 2023-06-25 — End: 2023-06-25

## 2023-06-25 MED ORDER — ESCITALOPRAM OXALATE 10 MG PO TABS
10.0000 mg | ORAL_TABLET | Freq: Every day | ORAL | 2 refills | Status: DC
Start: 2023-06-25 — End: 2023-08-20

## 2023-06-25 NOTE — Assessment & Plan Note (Signed)
    06/25/2023    1:08 PM 12/01/2022    8:59 AM 10/11/2022    9:40 AM 09/12/2022    2:05 PM  GAD 7 : Generalized Anxiety Score  Nervous, Anxious, on Edge 2 1 1 1   Control/stop worrying 3 1 2 1   Worry too much - different things 3 1 2 1   Trouble relaxing 3 2 1 1   Restless 3 2 1 1   Easily annoyed or irritable 1 1 1 1   Afraid - awful might happen 1 1 1  0  Total GAD 7 Score 16 9 9 6   Anxiety Difficulty Somewhat difficult  Not difficult at all      Flowsheet Row Office Visit from 06/25/2023 in [facility]  PHQ-9 Total Score 15       Patient would like to wean off Effexor due to side effects of hand tremors Provided patient a 4 week tapering scheduling Start Lexapro 10 mg once daily Follow up in 8 weeks for medication management and follow up Patient denied referral to behavior health services   We discussed several non-pharmacological approaches to managing depression, including:  Establishing a consistent daily routine: This helps create structure and stability. Practicing mindfulness and relaxation techniques: Incorporating meditation, deep breathing exercises, or yoga to manage stress and improve emotional well-being. Engaging in regular physical activity: Aim for at least 30 minutes of exercise most days to boost mood and energy levels. Spending time outdoors: Exposure to natural light and fresh air can improve mental health. Building a support network: Encouraging social connections with friends, family, or support groups to reduce feelings of isolation. Prioritizing a balanced diet: Eating nutrient-rich foods while avoiding excessive amounts of processed foods, sugar, and unhealthy fats. Follow-up is recommended in 4-8 weeks to assess progress, with a referral to behavioral health for further support if needed.

## 2023-07-06 LAB — HEMOGLOBIN A1C
Est. average glucose Bld gHb Est-mCnc: 111 mg/dL
Hgb A1c MFr Bld: 5.5 % (ref 4.8–5.6)

## 2023-07-06 LAB — LIPID PANEL
Chol/HDL Ratio: 2.5 ratio (ref 0.0–4.4)
Cholesterol, Total: 232 mg/dL — ABNORMAL HIGH (ref 100–199)
HDL: 93 mg/dL (ref >39)
LDL Chol Calc (NIH): 118 mg/dL — ABNORMAL HIGH (ref 0–99)
Triglycerides: 125 mg/dL (ref 0–149)
VLDL Cholesterol Cal: 21 mg/dL (ref 5–40)

## 2023-07-06 LAB — CBC WITH DIFFERENTIAL/PLATELET
Basophils Absolute: 0 10*3/uL (ref 0.0–0.2)
Basos: 0 %
EOS (ABSOLUTE): 0.2 10*3/uL (ref 0.0–0.4)
Eos: 2 %
Hematocrit: 45.4 % (ref 34.0–46.6)
Hemoglobin: 15.1 g/dL (ref 11.1–15.9)
Immature Grans (Abs): 0 10*3/uL (ref 0.0–0.1)
Immature Granulocytes: 0 %
Lymphocytes Absolute: 2.9 10*3/uL (ref 0.7–3.1)
Lymphs: 37 %
MCH: 32.1 pg (ref 26.6–33.0)
MCHC: 33.3 g/dL (ref 31.5–35.7)
MCV: 96 fL (ref 79–97)
Monocytes Absolute: 0.6 10*3/uL (ref 0.1–0.9)
Monocytes: 8 %
Neutrophils Absolute: 4 10*3/uL (ref 1.4–7.0)
Neutrophils: 53 %
Platelets: 274 10*3/uL (ref 150–450)
RBC: 4.71 x10E6/uL (ref 3.77–5.28)
RDW: 12.4 % (ref 11.7–15.4)
WBC: 7.7 10*3/uL (ref 3.4–10.8)

## 2023-07-06 LAB — CMP14+EGFR
ALT: 24 [IU]/L (ref 0–32)
AST: 24 [IU]/L (ref 0–40)
Albumin: 4.4 g/dL (ref 3.8–4.9)
Alkaline Phosphatase: 128 [IU]/L — ABNORMAL HIGH (ref 44–121)
BUN/Creatinine Ratio: 9 (ref 9–23)
BUN: 9 mg/dL (ref 6–24)
Bilirubin Total: <0.2 mg/dL (ref 0.0–1.2)
CO2: 22 mmol/L (ref 20–29)
Calcium: 9.4 mg/dL (ref 8.7–10.2)
Chloride: 105 mmol/L (ref 96–106)
Creatinine, Ser: 1.04 mg/dL — ABNORMAL HIGH (ref 0.57–1.00)
Globulin, Total: 2.4 g/dL (ref 1.5–4.5)
Glucose: 88 mg/dL (ref 70–99)
Potassium: 4.4 mmol/L (ref 3.5–5.2)
Sodium: 143 mmol/L (ref 134–144)
Total Protein: 6.8 g/dL (ref 6.0–8.5)
eGFR: 65 mL/min/{1.73_m2} (ref >59)

## 2023-07-06 LAB — HIV ANTIBODY (ROUTINE TESTING W REFLEX): HIV Screen 4th Generation wRfx: NONREACTIVE

## 2023-07-06 LAB — MICROALBUMIN / CREATININE URINE RATIO
Creatinine, Urine: 94.8 mg/dL
Microalb/Creat Ratio: 4 mg/g{creat} (ref 0–29)
Microalbumin, Urine: 3.7 ug/mL

## 2023-07-06 LAB — HEPATITIS C ANTIBODY: Hep C Virus Ab: NONREACTIVE

## 2023-07-06 LAB — VITAMIN D 25 HYDROXY (VIT D DEFICIENCY, FRACTURES): Vit D, 25-Hydroxy: 35.5 ng/mL (ref 30.0–100.0)

## 2023-07-06 LAB — TSH+FREE T4
Free T4: 1.2 ng/dL (ref 0.82–1.77)
TSH: 2.67 u[IU]/mL (ref 0.450–4.500)

## 2023-07-06 LAB — VITAMIN B12: Vitamin B-12: 677 pg/mL (ref 232–1245)

## 2023-07-13 ENCOUNTER — Telehealth: Payer: Self-pay

## 2023-07-13 ENCOUNTER — Other Ambulatory Visit: Payer: Self-pay

## 2023-07-13 DIAGNOSIS — F17210 Nicotine dependence, cigarettes, uncomplicated: Secondary | ICD-10-CM

## 2023-07-13 DIAGNOSIS — Z122 Encounter for screening for malignant neoplasm of respiratory organs: Secondary | ICD-10-CM

## 2023-07-13 DIAGNOSIS — Z87891 Personal history of nicotine dependence: Secondary | ICD-10-CM

## 2023-07-13 NOTE — Telephone Encounter (Signed)
Patient returned call. Reviewed LCS program eligibility. SDMV and CT scheduled.

## 2023-07-13 NOTE — Telephone Encounter (Signed)
LVM for patient to return call and discuss LCS program.

## 2023-07-17 ENCOUNTER — Encounter: Payer: Medicaid Other | Admitting: Acute Care

## 2023-07-17 ENCOUNTER — Telehealth: Payer: Self-pay | Admitting: Acute Care

## 2023-07-17 NOTE — Telephone Encounter (Signed)
I have attempted to call the patient for their scheduled 1:30 pm shared decision making visit.  There was no answer. I have left a HIPPA compliant VM requesting the patient call the office so we can get he visit done. I included the office contact information in the message. We will await his return call. If no return call we will have to reschedule the scan and SDMV.   I called the patient a second time at 1:45 pm. She did not answer. At this point , with 15 minutes until my next patient , there is not time to complete the visit.  We will call her and get her rescheduled.

## 2023-07-17 NOTE — Telephone Encounter (Signed)
Appt rescheduled by DB.

## 2023-07-18 ENCOUNTER — Ambulatory Visit: Payer: Medicaid Other | Admitting: Acute Care

## 2023-07-18 DIAGNOSIS — F17210 Nicotine dependence, cigarettes, uncomplicated: Secondary | ICD-10-CM

## 2023-07-18 NOTE — Progress Notes (Signed)
  Virtual Visit via Telephone Note  I connected with Lacey Woodard on 07/18/23 at  2:00 PM EST by telephone and verified that I am speaking with the correct person using two identifiers.  Location: Patient: in home Provider: [address] [place], [place], [address]   Shared Decision Making Visit Lung Cancer Screening Program [phone])   Eligibility: Age 52 y.o. Pack Years Smoking History Calculation 51 (# packs/per year x # years smoked) Recent History of coughing up blood  no Unexplained weight loss? no ( >Than 15 pounds within the last 6 months ) Prior History Lung / other cancer no (Diagnosis within the last 5 years already requiring surveillance chest CT Scans). Smoking Status Current Smoker Former Smokers: Years since quit: NA  Quit Date: NA  Visit Components: Discussion included one or more decision making aids. yes Discussion included risk/benefits of screening. yes Discussion included potential follow up diagnostic testing for abnormal scans. yes Discussion included meaning and risk of over diagnosis. yes Discussion included meaning and risk of False Positives. yes Discussion included meaning of total radiation exposure. yes  Counseling Included: Importance of adherence to annual lung cancer LDCT screening. yes Impact of comorbidities on ability to participate in the program. yes Ability and willingness to under diagnostic treatment. yes  Smoking Cessation Counseling: Current Smokers:  Discussed importance of smoking cessation. yes Information about tobacco cessation classes and interventions provided to patient. yes Patient provided with "ticket" for LDCT Scan. yes Symptomatic Patient. no  Counseling NA Diagnosis Code: Tobacco Use Z72.0 Asymptomatic Patient yes  Counseling (Intermediate counseling: > three minutes counseling) J1914 Former Smokers:  Discussed the importance of maintaining cigarette abstinence. yes Diagnosis Code: Personal History of  Nicotine Dependence. N82.956 Information about tobacco cessation classes and interventions provided to patient. Yes Patient provided with "ticket" for LDCT Scan. yes Written Order for Lung Cancer Screening with LDCT placed in Epic. Yes (CT Chest Lung Cancer Screening Low Dose W/O CM) [id number] Z12.2-Screening of respiratory organs Z87.891-Personal history of nicotine dependence   Karl Bales, RN 07/18/23

## 2023-07-18 NOTE — Patient Instructions (Signed)

## 2023-07-23 ENCOUNTER — Ambulatory Visit (HOSPITAL_COMMUNITY): Admission: RE | Admit: 2023-07-23 | Payer: Medicaid Other | Source: Ambulatory Visit

## 2023-07-23 ENCOUNTER — Encounter (HOSPITAL_COMMUNITY): Payer: Self-pay

## 2023-07-30 ENCOUNTER — Ambulatory Visit (HOSPITAL_COMMUNITY): Payer: Medicaid Other

## 2023-07-31 ENCOUNTER — Ambulatory Visit (HOSPITAL_COMMUNITY)
Admission: RE | Admit: 2023-07-31 | Discharge: 2023-07-31 | Disposition: A | Discharge status: Home / Self Care | Payer: Medicaid Other | Source: Ambulatory Visit | Attending: Acute Care | Admitting: Acute Care

## 2023-07-31 DIAGNOSIS — F17210 Nicotine dependence, cigarettes, uncomplicated: Secondary | ICD-10-CM | POA: Diagnosis present

## 2023-07-31 DIAGNOSIS — I700 Atherosclerosis of aorta: Secondary | ICD-10-CM | POA: Diagnosis not present

## 2023-07-31 DIAGNOSIS — J439 Emphysema, unspecified: Secondary | ICD-10-CM | POA: Diagnosis not present

## 2023-07-31 DIAGNOSIS — I2510 Atherosclerotic heart disease of native coronary artery without angina pectoris: Secondary | ICD-10-CM | POA: Diagnosis not present

## 2023-07-31 DIAGNOSIS — Z122 Encounter for screening for malignant neoplasm of respiratory organs: Secondary | ICD-10-CM

## 2023-07-31 DIAGNOSIS — Z87891 Personal history of nicotine dependence: Secondary | ICD-10-CM | POA: Diagnosis present

## 2023-08-17 ENCOUNTER — Telehealth: Payer: Medicaid Other | Admitting: Physician Assistant

## 2023-08-17 DIAGNOSIS — T3695XA Adverse effect of unspecified systemic antibiotic, initial encounter: Secondary | ICD-10-CM

## 2023-08-17 DIAGNOSIS — J069 Acute upper respiratory infection, unspecified: Secondary | ICD-10-CM

## 2023-08-17 DIAGNOSIS — B379 Candidiasis, unspecified: Secondary | ICD-10-CM

## 2023-08-17 DIAGNOSIS — B9689 Other specified bacterial agents as the cause of diseases classified elsewhere: Secondary | ICD-10-CM | POA: Diagnosis not present

## 2023-08-17 MED ORDER — FLUCONAZOLE 150 MG PO TABS
150.0000 mg | ORAL_TABLET | ORAL | 0 refills | Status: DC | PRN
Start: 2023-08-17 — End: 2023-09-05

## 2023-08-17 MED ORDER — PROMETHAZINE-DM 6.25-15 MG/5ML PO SYRP
5.0000 mL | ORAL_SOLUTION | Freq: Four times a day (QID) | ORAL | 0 refills | Status: DC | PRN
Start: 2023-08-17 — End: 2023-12-04

## 2023-08-17 MED ORDER — AMOXICILLIN-POT CLAVULANATE 875-125 MG PO TABS
1.0000 | ORAL_TABLET | Freq: Two times a day (BID) | ORAL | 0 refills | Status: DC
Start: 2023-08-17 — End: 2023-09-04

## 2023-08-17 NOTE — Patient Instructions (Signed)
Dory Horn, thank you for joining Margaretann Loveless, PA-C for today's virtual visit.  While this provider is not your primary care provider (PCP), if your PCP is located in our provider database this encounter information will be shared with them immediately following your visit.   A [facility] MyChart account gives you access to today's visit and all your visits, tests, and labs performed at [facility] " click here if you don't have a [facility] MyChart account or go to [email or web address]  Consent: (Patient) Dory Horn provided verbal consent for this virtual visit at the beginning of the encounter.  Current Medications:  Current Outpatient Medications:    amoxicillin-clavulanate (AUGMENTIN) 875-125 MG tablet, Take 1 tablet by mouth 2 (two) times daily., Disp: 20 tablet, Rfl: 0   fluconazole (DIFLUCAN) 150 MG tablet, Take 1 tablet (150 mg total) by mouth every 3 (three) days as needed., Disp: 3 tablet, Rfl: 0   promethazine-dextromethorphan (PROMETHAZINE-DM) 6.25-15 MG/5ML syrup, Take 5 mLs by mouth 4 (four) times daily as needed., Disp: 118 mL, Rfl: 0   albuterol (VENTOLIN HFA) 108 (90 Base) MCG/ACT inhaler, Inhale 2 puffs into the lungs every 6 (six) hours as needed for wheezing or shortness of breath. (Patient not taking: Reported on 06/25/2023), Disp: 8 g, Rfl: 0   ALPRAZolam (XANAX) 0.5 MG tablet, Take 0.5 mg by mouth at bedtime as needed for sleep., Disp: , Rfl: 0   Biotin 5000 MCG TABS, Take 5,000 mcg by mouth daily., Disp: , Rfl:    Cholecalciferol (VITAMIN D3) 25 MCG (1000 UT) CAPS, Take 1,000 Units by mouth daily., Disp: , Rfl:    cyclobenzaprine (FLEXERIL) 5 MG tablet, SMARTSIG:1 Tablet(s) By Mouth 1-3 Times Daily, Disp: , Rfl:    escitalopram (LEXAPRO) 10 MG tablet, Take 1 tablet (10 mg total) by mouth daily., Disp: 30 tablet, Rfl: 2   estradiol (ESTRACE) 2 MG tablet, Take 1 tablet (2 mg total) by mouth daily., Disp: 30 tablet, Rfl: 6    fluticasone (FLONASE) 50 MCG/ACT nasal spray, Place 2 sprays into both nostrils daily., Disp: , Rfl:    magnesium oxide (MAG-OX) 400 (240 Mg) MG tablet, Take 400 mg by mouth at bedtime., Disp: , Rfl:    methocarbamol (ROBAXIN) 500 MG tablet, Take 1 tablet (500 mg total) by mouth every 8 (eight) hours as needed for muscle spasms. (Patient not taking: Reported on 06/25/2023), Disp: 30 tablet, Rfl: 0   oxybutynin (DITROPAN XL) 10 MG 24 hr tablet, Take 1 tablet (10 mg total) by mouth daily., Disp: 30 tablet, Rfl: 6   venlafaxine XR (EFFEXOR-XR) 150 MG 24 hr capsule, Take 150 mg by mouth in the morning and at bedtime., Disp: , Rfl: 0   Medications ordered in this encounter:  Meds ordered this encounter  Medications   amoxicillin-clavulanate (AUGMENTIN) 875-125 MG tablet    Sig: Take 1 tablet by mouth 2 (two) times daily.    Dispense:  20 tablet    Refill:  0    Supervising Provider:   Merrilee Jansky [[id number]]   promethazine-dextromethorphan (PROMETHAZINE-DM) 6.25-15 MG/5ML syrup    Sig: Take 5 mLs by mouth 4 (four) times daily as needed.    Dispense:  118 mL    Refill:  0    Supervising Provider:   Merrilee Jansky [[id number]]   fluconazole (DIFLUCAN) 150 MG tablet    Sig: Take 1 tablet (150 mg total) by mouth every 3 (three) days as needed.    Dispense:  3 tablet    Refill:  0    Supervising Provider:   Merrilee Jansky [[id number]]     *If you need refills on other medications prior to your next appointment, please contact your pharmacy*  Follow-Up: Call back or seek an in-person evaluation if the symptoms worsen or if the condition fails to improve as anticipated.  [facility] [phone]  Other Instructions  Upper Respiratory Infection, Adult An upper respiratory infection (URI) is a common viral infection of the nose, throat, and upper air passages that lead to the lungs. The most common type of URI is the common cold. URIs usually get better on their own, without  medical treatment. What are the causes? A URI is caused by a virus. You may catch a virus by: Breathing in droplets from an infected person's cough or sneeze. Touching something that has been exposed to the virus (is contaminated) and then touching your mouth, nose, or eyes. What increases the risk? You are more likely to get a URI if: You are very young or very old. You have close contact with others, such as at work, school, or a health care facility. You smoke. You have long-term (chronic) heart or lung disease. You have a weakened disease-fighting system (immune system). You have nasal allergies or asthma. You are experiencing a lot of stress. You have poor nutrition. What are the signs or symptoms? A URI usually involves some of the following symptoms: Runny or stuffy (congested) nose. Cough. Sneezing. Sore throat. Headache. Fatigue. Fever. Loss of appetite. Pain in your forehead, behind your eyes, and over your cheekbones (sinus pain). Muscle aches. Redness or irritation of the eyes. Pressure in the ears or face. How is this diagnosed? This condition may be diagnosed based on your medical history and symptoms, and a physical exam. Your health care provider may use a swab to take a mucus sample from your nose (nasal swab). This sample can be tested to determine what virus is causing the illness. How is this treated? URIs usually get better on their own within 7-10 days. Medicines cannot cure URIs, but your health care provider may recommend certain medicines to help relieve symptoms, such as: Over-the-counter cold medicines. Cough suppressants. Coughing is a type of defense against infection that helps to clear the respiratory system, so take these medicines only as recommended by your health care provider. Fever-reducing medicines. Follow these instructions at home: Activity Rest as needed. If you have a fever, stay home from work or school until your fever is gone or  until your health care provider says your URI cannot spread to other people (is no longer contagious). Your health care provider may have you wear a face mask to prevent your infection from spreading. Relieving symptoms Gargle with a mixture of salt and water 3-4 times a day or as needed. To make salt water, completely dissolve -1 tsp (3-6 g) of salt in 1 cup (237 mL) of warm water. Use a cool-mist humidifier to add moisture to the air. This can help you breathe more easily. Eating and drinking  Drink enough fluid to keep your urine pale yellow. Eat soups and other clear broths. General instructions  Take over-the-counter and prescription medicines only as told by your health care provider. These include cold medicines, fever reducers, and cough suppressants. Do not use any products that contain nicotine or tobacco. These products include cigarettes, chewing tobacco, and vaping devices, such as e-cigarettes. If you need help quitting, ask your  health care provider. Stay away from secondhand smoke. Stay up to date on all immunizations, including the yearly (annual) flu vaccine. Keep all follow-up visits. This is important. How to prevent the spread of infection to others URIs can be contagious. To prevent the infection from spreading: Wash your hands with soap and water for at least 20 seconds. If soap and water are not available, use hand sanitizer. Avoid touching your mouth, face, eyes, or nose. Cough or sneeze into a tissue or your sleeve or elbow instead of into your hand or into the air.  Contact a health care provider if: You are getting worse instead of better. You have a fever or chills. Your mucus is brown or red. You have yellow or brown discharge coming from your nose. You have pain in your face, especially when you bend forward. You have swollen neck glands. You have pain while swallowing. You have white areas in the back of your throat. Get help right away if: You have  shortness of breath that gets worse. You have severe or persistent: Headache. Ear pain. Sinus pain. Chest pain. You have chronic lung disease along with any of the following: Making high-pitched whistling sounds when you breathe, most often when you breathe out (wheezing). Prolonged cough (more than 14 days). Coughing up blood. A change in your usual mucus. You have a stiff neck. You have changes in your: Vision. Hearing. Thinking. Mood. These symptoms may be an emergency. Get help right away. Call 911. Do not wait to see if the symptoms will go away. Do not drive yourself to the hospital. Summary An upper respiratory infection (URI) is a common infection of the nose, throat, and upper air passages that lead to the lungs. A URI is caused by a virus. URIs usually get better on their own within 7-10 days. Medicines cannot cure URIs, but your health care provider may recommend certain medicines to help relieve symptoms. This information is not intended to replace advice given to you by your health care provider. Make sure you discuss any questions you have with your health care provider. Document Revised: 03/16/2021 Document Reviewed: 03/16/2021 Elsevier Patient Education  2024 Elsevier Inc.    If you have been instructed to have an in-person evaluation today at a local Urgent Care facility, please use the link below. It will take you to a list of all of our available [facility], including address, phone number and hours of operation. Please do not delay care.  [facility]  If you or a family member do not have a primary care provider, use the link below to schedule a visit and establish care. When you choose a [facility] primary care physician or advanced practice provider, you gain a long-term partner in health. Find a Primary Care Provider  Learn more about [facility] in-office and virtual care options:  - Get Care Now

## 2023-08-17 NOTE — Progress Notes (Signed)
Virtual Visit Consent   Lacey Woodard, you are scheduled for a virtual visit with a [facility] provider today. Just as with appointments in the office, your consent must be obtained to participate. Your consent will be active for this visit and any virtual visit you may have with one of our providers in the next 365 days. If you have a MyChart account, a copy of this consent can be sent to you electronically.  As this is a virtual visit, video technology does not allow for your provider to perform a traditional examination. This may limit your provider's ability to fully assess your condition. If your provider identifies any concerns that need to be evaluated in person or the need to arrange testing (such as labs, EKG, etc.), we will make arrangements to do so. Although advances in technology are sophisticated, we cannot ensure that it will always work on either your end or our end. If the connection with a video visit is poor, the visit may have to be switched to a telephone visit. With either a video or telephone visit, we are not always able to ensure that we have a secure connection.  By engaging in this virtual visit, you consent to the provision of healthcare and authorize for your insurance to be billed (if applicable) for the services provided during this visit. Depending on your insurance coverage, you may receive a charge related to this service.  I need to obtain your verbal consent now. Are you willing to proceed with your visit today? Lacey Woodard has provided verbal consent on 08/17/2023 for a virtual visit (video or telephone). Margaretann Loveless, PA-C  Date: 08/17/2023 11:47 AM  Virtual Visit via Video Note   I, Margaretann Loveless, connected with  Lacey Woodard  ([id number], 02-18-1971) on 08/17/23 at 11:45 AM EST by a video-enabled telemedicine application and verified that I am speaking with the correct person using two identifiers.  Location: Patient: Virtual Visit  Location Patient: Home Provider: Virtual Visit Location Provider: Home Office   I discussed the limitations of evaluation and management by telemedicine and the availability of in person appointments. The patient expressed understanding and agreed to proceed.    History of Present Illness: Lacey Woodard is a 52 y.o. who identifies as a female who was assigned female at birth, and is being seen today for URI symptoms.  HPI: URI  This is a new problem. The current episode started in the past 7 days. The problem has been gradually worsening. There has been no fever. Associated symptoms include congestion, coughing, ear pain (left), headaches, a plugged ear sensation (left), rhinorrhea, sinus pain, a sore throat and wheezing (mild). Pertinent negatives include no diarrhea, nausea or vomiting. Treatments tried: robitussin. The treatment provided no relief.     Problems:  Patient Active Problem List   Diagnosis Date Noted   Anxiety and depression 06/25/2023   Mixed stress and urge urinary incontinence 12/01/2022   Current use of estrogen therapy 10/11/2022   Hair thinning 09/12/2022   Moody 09/12/2022   S/P hysterectomy with oophorectomy 09/12/2022   Dyspareunia in female 09/12/2022   Vaginal dryness 09/12/2022   Night sweats 09/12/2022    Allergies: No Known Allergies Medications:  Current Outpatient Medications:    amoxicillin-clavulanate (AUGMENTIN) 875-125 MG tablet, Take 1 tablet by mouth 2 (two) times daily., Disp: 20 tablet, Rfl: 0   fluconazole (DIFLUCAN) 150 MG tablet, Take 1 tablet (150 mg total) by mouth every 3 (three) days  as needed., Disp: 3 tablet, Rfl: 0   promethazine-dextromethorphan (PROMETHAZINE-DM) 6.25-15 MG/5ML syrup, Take 5 mLs by mouth 4 (four) times daily as needed., Disp: 118 mL, Rfl: 0   albuterol (VENTOLIN HFA) 108 (90 Base) MCG/ACT inhaler, Inhale 2 puffs into the lungs every 6 (six) hours as needed for wheezing or shortness of breath. (Patient not taking:  Reported on 06/25/2023), Disp: 8 g, Rfl: 0   ALPRAZolam (XANAX) 0.5 MG tablet, Take 0.5 mg by mouth at bedtime as needed for sleep., Disp: , Rfl: 0   Biotin 5000 MCG TABS, Take 5,000 mcg by mouth daily., Disp: , Rfl:    Cholecalciferol (VITAMIN D3) 25 MCG (1000 UT) CAPS, Take 1,000 Units by mouth daily., Disp: , Rfl:    cyclobenzaprine (FLEXERIL) 5 MG tablet, SMARTSIG:1 Tablet(s) By Mouth 1-3 Times Daily, Disp: , Rfl:    escitalopram (LEXAPRO) 10 MG tablet, Take 1 tablet (10 mg total) by mouth daily., Disp: 30 tablet, Rfl: 2   estradiol (ESTRACE) 2 MG tablet, Take 1 tablet (2 mg total) by mouth daily., Disp: 30 tablet, Rfl: 6   fluticasone (FLONASE) 50 MCG/ACT nasal spray, Place 2 sprays into both nostrils daily., Disp: , Rfl:    magnesium oxide (MAG-OX) 400 (240 Mg) MG tablet, Take 400 mg by mouth at bedtime., Disp: , Rfl:    methocarbamol (ROBAXIN) 500 MG tablet, Take 1 tablet (500 mg total) by mouth every 8 (eight) hours as needed for muscle spasms. (Patient not taking: Reported on 06/25/2023), Disp: 30 tablet, Rfl: 0   oxybutynin (DITROPAN XL) 10 MG 24 hr tablet, Take 1 tablet (10 mg total) by mouth daily., Disp: 30 tablet, Rfl: 6   venlafaxine XR (EFFEXOR-XR) 150 MG 24 hr capsule, Take 150 mg by mouth in the morning and at bedtime., Disp: , Rfl: 0  Observations/Objective: Patient is well-developed, well-nourished in no acute distress.  Resting comfortably at home.  Head is normocephalic, atraumatic.  No labored breathing.  Speech is clear and coherent with logical content.  Patient is alert and oriented at baseline.    Assessment and Plan: 1. Bacterial upper respiratory infection (Primary) - amoxicillin-clavulanate (AUGMENTIN) 875-125 MG tablet; Take 1 tablet by mouth 2 (two) times daily.  Dispense: 20 tablet; Refill: 0 - promethazine-dextromethorphan (PROMETHAZINE-DM) 6.25-15 MG/5ML syrup; Take 5 mLs by mouth 4 (four) times daily as needed.  Dispense: 118 mL; Refill: 0  2.  Antibiotic-induced yeast infection - fluconazole (DIFLUCAN) 150 MG tablet; Take 1 tablet (150 mg total) by mouth every 3 (three) days as needed.  Dispense: 3 tablet; Refill: 0  - Worsening symptoms that have not responded to OTC medications.  - Will give Augmentin - Promethazine DM for cough - Continue allergy medications.  - Steam and humidifier can help - Stay well hydrated and get plenty of rest.  - Diflucan given as prophylaxis as patient tends to get vaginal yeast infections with antibiotic use - Seek in person evaluation if no symptom improvement or if symptoms worsen   Follow Up Instructions: I discussed the assessment and treatment plan with the patient. The patient was provided an opportunity to ask questions and all were answered. The patient agreed with the plan and demonstrated an understanding of the instructions.  A copy of instructions were sent to the patient via MyChart unless otherwise noted below.    The patient was advised to call back or seek an in-person evaluation if the symptoms worsen or if the condition fails to improve as anticipated.  Margaretann Loveless, PA-C

## 2023-08-19 NOTE — Patient Instructions (Signed)
        Great to see you today.  I have refilled the medication(s) we provide.    Aortic Atherosclerosis (ICD10-I70.0):  What It Means: This is a buildup of plaque in the aorta, the largest artery in your body, which can weaken the artery walls and affect blood flow. Recommendations: Lifestyle Changes: Incorporating a healthy diet, regular exercise, and stress management can help reduce risk.   Emphysema (ICD10-J43.9):  What It Means: Emphysema is a chronic lung condition where the air sacs in your lungs become damaged, making it harder to breathe. Recommendations: Smoking Cessation: If applicable, quitting smoking is the most critical step. Medications: Inhalers or other treatments to open airways and manage symptoms. Vaccinations: Influenza and pneumonia vaccines to prevent infections.   Why Bullae or Blebs Occur in Emphysema: Emphysema Pathophysiology: Emphysema damages the alveoli (tiny air sacs) in the lungs, causing them to lose elasticity and coalesce into larger air spaces. Over time, these can form bullae (large air pockets) or blebs (smaller, localized air pockets).    If labs were collected, we will inform you of lab results once received either by echart message or telephone call.   - echart message- for normal results that have been seen by the patient already.   - telephone call: abnormal results or if patient has not viewed results in their echart.   - Please take medications as prescribed. - Follow up with your primary health provider if any health concerns arises. - If symptoms worsen please contact your primary care provider and/or visit the emergency department.

## 2023-08-19 NOTE — Progress Notes (Unsigned)
   Established Patient Office Visit   Subjective  Patient ID: Lacey Woodard, female    DOB: 05-Jul-1971  Age: 52 y.o. MRN: [id number]  No chief complaint on file.   She  has a past medical history of Anxiety.  HPI  ROS    Objective:     There were no vitals taken for this visit. {Vitals History (Optional):23777}  Physical Exam   No results found for any visits on 08/20/23.  The 10-year ASCVD risk score (Arnett DK, et al., 2019) is: 1.8%    Assessment & Plan:  There are no diagnoses linked to this encounter.  No follow-ups on file.   Cruzita Lederer Newman Nip, FNP

## 2023-08-20 ENCOUNTER — Ambulatory Visit (INDEPENDENT_AMBULATORY_CARE_PROVIDER_SITE_OTHER): Payer: Medicaid Other | Admitting: Family Medicine

## 2023-08-20 VITALS — BP 117/75 | HR 91 | Ht 66.0 in | Wt 146.1 lb

## 2023-08-20 DIAGNOSIS — E785 Hyperlipidemia, unspecified: Secondary | ICD-10-CM | POA: Diagnosis not present

## 2023-08-20 DIAGNOSIS — F32A Depression, unspecified: Secondary | ICD-10-CM | POA: Diagnosis not present

## 2023-08-20 DIAGNOSIS — F419 Anxiety disorder, unspecified: Secondary | ICD-10-CM | POA: Diagnosis not present

## 2023-08-20 DIAGNOSIS — J439 Emphysema, unspecified: Secondary | ICD-10-CM | POA: Insufficient documentation

## 2023-08-20 MED ORDER — ALPRAZOLAM 0.5 MG PO TABS: 0.5000 mg | ORAL_TABLET | Freq: Two times a day (BID) | ORAL | 0 refills | Status: DC | PRN

## 2023-08-20 MED ORDER — ESCITALOPRAM OXALATE 20 MG PO TABS: 20.0000 mg | ORAL_TABLET | Freq: Every day | ORAL | 2 refills | Status: DC

## 2023-08-20 MED ORDER — ARIPIPRAZOLE 10 MG PO TABS: 10.0000 mg | ORAL_TABLET | Freq: Every day | ORAL | 3 refills | Status: DC

## 2023-08-20 MED ORDER — ROSUVASTATIN CALCIUM 40 MG PO TABS: 40.0000 mg | ORAL_TABLET | Freq: Every day | ORAL | 3 refills | Status: DC

## 2023-08-20 MED ORDER — GABAPENTIN 300 MG PO CAPS
300.0000 mg | ORAL_CAPSULE | Freq: Two times a day (BID) | ORAL | 3 refills | Status: AC | PRN
Start: 2023-08-20 — End: ?

## 2023-08-20 MED ORDER — ALBUTEROL SULFATE HFA 108 (90 BASE) MCG/ACT IN AERS: 2.0000 | INHALATION_SPRAY | Freq: Four times a day (QID) | RESPIRATORY_TRACT | 0 refills | Status: DC | PRN

## 2023-08-20 NOTE — Assessment & Plan Note (Signed)
    06/25/2023    1:08 PM 12/01/2022    8:59 AM 10/11/2022    9:40 AM 09/12/2022    2:05 PM  GAD 7 : Generalized Anxiety Score  Nervous, Anxious, on Edge 2 1 1 1   Control/stop worrying 3 1 2 1   Worry too much - different things 3 1 2 1   Trouble relaxing 3 2 1 1   Restless 3 2 1 1   Easily annoyed or irritable 1 1 1 1   Afraid - awful might happen 1 1 1  0  Total GAD 7 Score 16 9 9 6   Anxiety Difficulty Somewhat difficult  Not difficult at all    Flowsheet Row Office Visit from 08/20/2023 in [facility]  PHQ-9 Total Score 27       Failed on Effexor  , and previous SNRI'S Increased Lexapro 20 mg once daily, Trial on Abilify Patient requesting referral to psychiatry for therapy and medication management Continued discussion on lifestyle changes, establishing a daily routine, going outdoors, exercise, healthy eating habits, mindfulness and mediatation.

## 2023-08-20 NOTE — Assessment & Plan Note (Addendum)
Emphysema showed on CT imaging Patient reports SOB episodes twice a week Refilled Albuterol inhaler Referral placed to Pulmonary  Advise on smoking cessation, Discussed to avoid triggers, try to chew gum when an urge arises, Keep yourself busy by having a daily routine. Work on finding something to enjoy doing that is easy to pick and put down at a moment notice such as crossword puzzle, knitting  or crochet.

## 2023-08-20 NOTE — Assessment & Plan Note (Signed)
Crestor 40 mg once daily Previous LDL 118 Recent CT imaging showed aortic atherosclerosis  Advise on Diet tip  to Lower Cholesterol Eat More: Oats, beans, and lentils: High in soluble fiber. Fatty fish: Salmon, tuna (rich in omega-3s). Nuts and seeds: Almonds, walnuts, flaxseeds. Fruits and vegetables: Apples, berries, leafy greens. Healthy fats: Olive oil, avocado. Limit: Saturated fats: Butter, cream, fatty meats. Trans fats: Fried foods, processed snacks. Sugar and refined carbs: Sweets, white bread. Focus on whole foods, healthy fats, and fiber to improve heart health! Maintain an exercise routine 3 to 5 days a week for a minimum total of 150 minutes.

## 2023-08-28 ENCOUNTER — Ambulatory Visit: Payer: Medicaid Other | Admitting: Adult Health

## 2023-08-30 ENCOUNTER — Other Ambulatory Visit: Payer: Self-pay

## 2023-08-30 ENCOUNTER — Telehealth: Payer: Self-pay | Admitting: Acute Care

## 2023-08-30 DIAGNOSIS — Z122 Encounter for screening for malignant neoplasm of respiratory organs: Secondary | ICD-10-CM

## 2023-08-30 DIAGNOSIS — F17210 Nicotine dependence, cigarettes, uncomplicated: Secondary | ICD-10-CM

## 2023-08-30 DIAGNOSIS — Z87891 Personal history of nicotine dependence: Secondary | ICD-10-CM

## 2023-08-30 NOTE — Telephone Encounter (Signed)
 Patient had first LDCT on 07/31/2023 that resulted as LR1. Patient has a bulla noted on scan that was present in 11/2022 and is stable. After review [name] NP agrees with 12 month annual scan, due 07/31/2024. Per pt's chart she is on statin therapy for CAD.   IMPRESSION: 1. Lung-RADS 1, negative. Continue annual screening with low-dose chest CT without contrast in 12 months. 2. Age advanced coronary artery atherosclerosis. Recommend assessment of coronary risk factors. 3. Aortic atherosclerosis (ICD10-I70.0) and emphysema (ICD10-J43.9). 4. Dominant right apical bullous/bleb versus less likely loculated small volume pneumothorax. Similar to plain film of 12/24/2022.     Electronically Signed   By: Rockey Kilts M.D.   On: 08/16/2023 10:23

## 2023-08-30 NOTE — Telephone Encounter (Signed)
 Results faxed to PCP and new annual order for LDCT placed

## 2023-09-04 ENCOUNTER — Ambulatory Visit (INDEPENDENT_AMBULATORY_CARE_PROVIDER_SITE_OTHER): Payer: Medicaid Other | Admitting: Adult Health

## 2023-09-04 ENCOUNTER — Ambulatory Visit: Payer: Self-pay | Admitting: Podiatry

## 2023-09-04 ENCOUNTER — Encounter: Payer: Self-pay | Admitting: Adult Health

## 2023-09-04 VITALS — BP 114/74 | HR 89 | Ht 66.0 in | Wt 148.5 lb

## 2023-09-04 DIAGNOSIS — Z90721 Acquired absence of ovaries, unilateral: Secondary | ICD-10-CM

## 2023-09-04 DIAGNOSIS — N3946 Mixed incontinence: Secondary | ICD-10-CM | POA: Diagnosis not present

## 2023-09-04 DIAGNOSIS — Z79899 Other long term (current) drug therapy: Secondary | ICD-10-CM | POA: Diagnosis not present

## 2023-09-04 DIAGNOSIS — Z90710 Acquired absence of both cervix and uterus: Secondary | ICD-10-CM

## 2023-09-04 MED ORDER — ESTRADIOL 2 MG PO TABS: 2.0000 mg | ORAL_TABLET | Freq: Every day | ORAL | 6 refills | Status: AC

## 2023-09-04 MED ORDER — MIRABEGRON ER 25 MG PO TB24: 25.0000 mg | ORAL_TABLET | Freq: Every day | ORAL | 3 refills | Status: DC

## 2023-09-04 NOTE — Progress Notes (Signed)
  Subjective:     Patient ID: Lacey Woodard, female   DOB: 1971-08-10, 53 y.o.   MRN: [id number]  HPI Lacey Woodard is a 53 year old white female, divorced, sp hysterectomy back in follow up on taking ditropan  and estrace , and does not think the ditropan  is helping, but since decreasing the estrace  doing good, not has wet and no hot flashes.  PCP is I Polanco NP  Review of Systems Has urinary incontinence if has to go or coughs or sneezes No hot flashes and not as wet. Reviewed past medical,surgical, social and family history. Reviewed medications and allergies.     Objective:   Physical Exam BP 114/74 (BP Location: Left Arm, Patient Position: Sitting, Cuff Size: Normal)   Pulse 89   Ht 5' 6 (1.676 m)   Wt 148 lb 8 oz (67.4 kg)   BMI 23.97 kg/m     Skin warm and dry. Lungs: clear to ausculation bilaterally. Cardiovascular: regular rate and rhythm.   Upstream - 09/04/23 1116       Pregnancy Intention Screening   Does the patient want to become pregnant in the next year? N/A    Does the patient's partner want to become pregnant in the next year? N/A    Would the patient like to discuss contraceptive options today? N/A      Contraception Wrap Up   Current Method Female Sterilization   hyst   End Method Female Sterilization   hyst   Contraception Counseling Provided No             Assessment:     1. Current use of estrogen therapy (Primary) Refilled estrace  2 mg but she is taking 1/2 tablet now   2. S/P hysterectomy with oophorectomy   3. Mixed stress and urge urinary incontinence Stop ditropan  will rx myrbetriq  25 mg 1 daily Meds ordered this encounter  Medications   estradiol  (ESTRACE ) 2 MG tablet    Sig: Take 1 tablet (2 mg total) by mouth daily.    Dispense:  30 tablet    Refill:  6    Supervising Provider:   JAYNE MINDER H [[id number]]   mirabegron  ER (MYRBETRIQ ) 25 MG TB24 tablet    Sig: Take 1 tablet (25 mg total) by mouth daily.    Dispense:  30 tablet     Refill:  3    Supervising Provider:   JAYNE MINDER DEL [[id number]]       Plan:     Follow up in 3 months

## 2023-09-05 ENCOUNTER — Telehealth: Payer: Self-pay | Admitting: *Deleted

## 2023-09-05 ENCOUNTER — Encounter: Payer: Self-pay | Admitting: Family Medicine

## 2023-09-05 ENCOUNTER — Other Ambulatory Visit: Payer: Self-pay | Admitting: Adult Health

## 2023-09-05 MED ORDER — SOLIFENACIN SUCCINATE 10 MG PO TABS: 10.0000 mg | ORAL_TABLET | Freq: Every day | ORAL | 6 refills | Status: DC

## 2023-09-05 MED ORDER — FLUCONAZOLE 150 MG PO TABS: ORAL_TABLET | ORAL | 1 refills | Status: DC

## 2023-09-05 NOTE — Progress Notes (Signed)
 Rx diflucan and d/c myrbetriq (not covered by insurance)and rx vesicare

## 2023-09-05 NOTE — Telephone Encounter (Signed)
Will rx vesicare

## 2023-09-05 NOTE — Telephone Encounter (Signed)
 Medicaid prefers Child psychotherapist. Are either of these an option for her or try PA?

## 2023-09-11 ENCOUNTER — Other Ambulatory Visit: Payer: Self-pay | Admitting: Family Medicine

## 2023-09-11 MED ORDER — PRAVASTATIN SODIUM 40 MG PO TABS: 40.0000 mg | ORAL_TABLET | Freq: Every day | ORAL | 2 refills | Status: DC

## 2023-09-11 NOTE — Telephone Encounter (Signed)
 Crestor  can cause itching and low energy in some cases. I will send Pravastatin  40 mg as an alternative  Tips to Minimize Itching with Statins: Take with Food: This can help reduce mild side effects. Hydration: Ensure adequate water intake, as dehydration can exacerbate skin issues.

## 2023-09-19 ENCOUNTER — Ambulatory Visit: Payer: Medicaid Other | Admitting: Podiatry

## 2023-09-24 ENCOUNTER — Telehealth: Payer: Medicaid Other | Admitting: Emergency Medicine

## 2023-09-24 ENCOUNTER — Telehealth: Payer: Medicaid Other

## 2023-09-24 DIAGNOSIS — J019 Acute sinusitis, unspecified: Secondary | ICD-10-CM

## 2023-09-24 DIAGNOSIS — B9689 Other specified bacterial agents as the cause of diseases classified elsewhere: Secondary | ICD-10-CM | POA: Diagnosis not present

## 2023-09-24 MED ORDER — FLUCONAZOLE 150 MG PO TABS: ORAL_TABLET | ORAL | 0 refills | Status: DC

## 2023-09-24 MED ORDER — AMOXICILLIN-POT CLAVULANATE 875-125 MG PO TABS: 1.0000 | ORAL_TABLET | Freq: Two times a day (BID) | ORAL | 0 refills | Status: DC

## 2023-09-24 NOTE — Progress Notes (Signed)
Virtual Visit Consent   [name], you are scheduled for a virtual visit with a [facility] provider today. Just as with appointments in the office, your consent must be obtained to participate. Your consent will be active for this visit and any virtual visit you may have with one of our providers in the next 365 days. If you have a MyChart account, a copy of this consent can be sent to you electronically.  As this is a virtual visit, video technology does not allow for your provider to perform a traditional examination. This may limit your provider's ability to fully assess your condition. If your provider identifies any concerns that need to be evaluated in person or the need to arrange testing (such as labs, EKG, etc.), we will make arrangements to do so. Although advances in technology are sophisticated, we cannot ensure that it will always work on either your end or our end. If the connection with a video visit is poor, the visit may have to be switched to a telephone visit. With either a video or telephone visit, we are not always able to ensure that we have a secure connection.  By engaging in this virtual visit, you consent to the provision of healthcare and authorize for your insurance to be billed (if applicable) for the services provided during this visit. Depending on your insurance coverage, you may receive a charge related to this service.  I need to obtain your verbal consent now. Are you willing to proceed with your visit today? ALEXCIA SCHOOLS has provided verbal consent on 09/24/2023 for a virtual visit (video or telephone). Cathlyn Parsons, NP  Date: 09/24/2023 1:34 PM  Virtual Visit via Video Note   I, Cathlyn Parsons, connected with  SONAL DORWART  ([id number], 04/27/1971) on 09/24/23 at  1:30 PM EST by a video-enabled telemedicine application and verified that I am speaking with the correct person using two identifiers.  Location: Patient: Virtual Visit Location  Patient: Home Provider: Virtual Visit Location Provider: Home Office   I discussed the limitations of evaluation and management by telemedicine and the availability of in person appointments. The patient expressed understanding and agreed to proceed.    History of Present Illness: Lacey Woodard is a 53 y.o. who identifies as a female who was assigned female at birth, and is being seen today for sinus infection. Has yellow thick nasal discharge, headache and sinus pressure, and fever 101F for last 2 days. Denies sore throat, is coughing a little, denies body aches. Using sudafed, mucinex, and flonase  HPI: HPI  Problems:  Patient Active Problem List   Diagnosis Date Noted   Emphysema lung (HCC) 08/20/2023   Hyperlipidemia LDL goal <70 08/20/2023   Anxiety and depression 06/25/2023   Mixed stress and urge urinary incontinence 12/01/2022   Current use of estrogen therapy 10/11/2022   Hair thinning 09/12/2022   Moody 09/12/2022   S/P hysterectomy with oophorectomy 09/12/2022   Dyspareunia in female 09/12/2022   Vaginal dryness 09/12/2022   Night sweats 09/12/2022    Allergies: No Known Allergies Medications:  Current Outpatient Medications:    amoxicillin-clavulanate (AUGMENTIN) 875-125 MG tablet, Take 1 tablet by mouth 2 (two) times daily., Disp: 14 tablet, Rfl: 0   albuterol (VENTOLIN HFA) 108 (90 Base) MCG/ACT inhaler, Inhale 2 puffs into the lungs every 6 (six) hours as needed for wheezing or shortness of breath., Disp: 8 g, Rfl: 0   ALPRAZolam (XANAX) 0.5 MG tablet, Take 1  tablet (0.5 mg total) by mouth 2 (two) times daily as needed for anxiety or sleep., Disp: 60 tablet, Rfl: 0   ARIPiprazole (ABILIFY) 10 MG tablet, Take 1 tablet (10 mg total) by mouth daily., Disp: 30 tablet, Rfl: 3   Cholecalciferol (VITAMIN D3) 25 MCG (1000 UT) CAPS, Take 1,000 Units by mouth daily., Disp: , Rfl:    cyclobenzaprine (FLEXERIL) 5 MG tablet, SMARTSIG:1 Tablet(s) By Mouth 1-3 Times Daily, Disp: ,  Rfl:    escitalopram (LEXAPRO) 20 MG tablet, Take 1 tablet (20 mg total) by mouth daily., Disp: 30 tablet, Rfl: 2   estradiol (ESTRACE) 2 MG tablet, Take 1 tablet (2 mg total) by mouth daily., Disp: 30 tablet, Rfl: 6   fluconazole (DIFLUCAN) 150 MG tablet, Take 1 now and 1 in 3 days, Disp: 2 tablet, Rfl: 0   fluticasone (FLONASE) 50 MCG/ACT nasal spray, Place 2 sprays into both nostrils daily., Disp: , Rfl:    gabapentin (NEURONTIN) 300 MG capsule, Take 1 capsule (300 mg total) by mouth 2 (two) times daily as needed., Disp: 90 capsule, Rfl: 3   magnesium oxide (MAG-OX) 400 (240 Mg) MG tablet, Take 400 mg by mouth at bedtime., Disp: , Rfl:    pravastatin (PRAVACHOL) 40 MG tablet, Take 1 tablet (40 mg total) by mouth daily., Disp: 90 tablet, Rfl: 2   promethazine-dextromethorphan (PROMETHAZINE-DM) 6.25-15 MG/5ML syrup, Take 5 mLs by mouth 4 (four) times daily as needed., Disp: 118 mL, Rfl: 0   solifenacin (VESICARE) 10 MG tablet, Take 1 tablet (10 mg total) by mouth daily., Disp: 30 tablet, Rfl: 6  Observations/Objective: Patient is well-developed, well-nourished in no acute distress.  Resting comfortably  at home.  Head is normocephalic, atraumatic.  No labored breathing.  Speech is clear and coherent with logical content.  Patient is alert and oriented at baseline.    Assessment and Plan: 1. Acute bacterial sinusitis (Primary)  Pt reports frequent sinus infections. Last had antibiotics in December for bacterial resp infection. I am concernd about frquecy of sinus infections - recommend she talk with PCP about seein ENT  Follow Up Instructions: I discussed the assessment and treatment plan with the patient. The patient was provided an opportunity to ask questions and all were answered. The patient agreed with the plan and demonstrated an understanding of the instructions.  A copy of instructions were sent to the patient via MyChart unless otherwise noted below.   The patient was advised to  call back or seek an in-person evaluation if the symptoms worsen or if the condition fails to improve as anticipated.    Cathlyn Parsons, NP

## 2023-09-24 NOTE — Patient Instructions (Signed)
Lacey Woodard, thank you for joining Cathlyn Parsons, NP for today's virtual visit.  While this provider is not your primary care provider (PCP), if your PCP is located in our provider database this encounter information will be shared with them immediately following your visit.   A [facility] MyChart account gives you access to today's visit and all your visits, tests, and labs performed at [facility] " click here if you don't have a [facility] MyChart account or go to [email or web address]  Consent: (Patient) Lacey Woodard provided verbal consent for this virtual visit at the beginning of the encounter.  Current Medications:  Current Outpatient Medications:    amoxicillin-clavulanate (AUGMENTIN) 875-125 MG tablet, Take 1 tablet by mouth 2 (two) times daily., Disp: 14 tablet, Rfl: 0   albuterol (VENTOLIN HFA) 108 (90 Base) MCG/ACT inhaler, Inhale 2 puffs into the lungs every 6 (six) hours as needed for wheezing or shortness of breath., Disp: 8 g, Rfl: 0   ALPRAZolam (XANAX) 0.5 MG tablet, Take 1 tablet (0.5 mg total) by mouth 2 (two) times daily as needed for anxiety or sleep., Disp: 60 tablet, Rfl: 0   ARIPiprazole (ABILIFY) 10 MG tablet, Take 1 tablet (10 mg total) by mouth daily., Disp: 30 tablet, Rfl: 3   Cholecalciferol (VITAMIN D3) 25 MCG (1000 UT) CAPS, Take 1,000 Units by mouth daily., Disp: , Rfl:    cyclobenzaprine (FLEXERIL) 5 MG tablet, SMARTSIG:1 Tablet(s) By Mouth 1-3 Times Daily, Disp: , Rfl:    escitalopram (LEXAPRO) 20 MG tablet, Take 1 tablet (20 mg total) by mouth daily., Disp: 30 tablet, Rfl: 2   estradiol (ESTRACE) 2 MG tablet, Take 1 tablet (2 mg total) by mouth daily., Disp: 30 tablet, Rfl: 6   fluconazole (DIFLUCAN) 150 MG tablet, Take 1 now and 1 in 3 days, Disp: 2 tablet, Rfl: 0   fluticasone (FLONASE) 50 MCG/ACT nasal spray, Place 2 sprays into both nostrils daily., Disp: , Rfl:    gabapentin (NEURONTIN) 300 MG capsule, Take 1 capsule (300  mg total) by mouth 2 (two) times daily as needed., Disp: 90 capsule, Rfl: 3   magnesium oxide (MAG-OX) 400 (240 Mg) MG tablet, Take 400 mg by mouth at bedtime., Disp: , Rfl:    pravastatin (PRAVACHOL) 40 MG tablet, Take 1 tablet (40 mg total) by mouth daily., Disp: 90 tablet, Rfl: 2   promethazine-dextromethorphan (PROMETHAZINE-DM) 6.25-15 MG/5ML syrup, Take 5 mLs by mouth 4 (four) times daily as needed., Disp: 118 mL, Rfl: 0   solifenacin (VESICARE) 10 MG tablet, Take 1 tablet (10 mg total) by mouth daily., Disp: 30 tablet, Rfl: 6   Medications ordered in this encounter:  Meds ordered this encounter  Medications   amoxicillin-clavulanate (AUGMENTIN) 875-125 MG tablet    Sig: Take 1 tablet by mouth 2 (two) times daily.    Dispense:  14 tablet    Refill:  0   fluconazole (DIFLUCAN) 150 MG tablet    Sig: Take 1 now and 1 in 3 days    Dispense:  2 tablet    Refill:  0     *If you need refills on other medications prior to your next appointment, please contact your pharmacy*  Follow-Up: Call back or seek an in-person evaluation if the symptoms worsen or if the condition fails to improve as anticipated.  [facility] [phone]  Other Instructions  Talk with your primary care provider about seeing ENT for your frequent sinus infections.   Resume  using saline irrigation, like a neti pot, several times a day while you are sick. Many neti pots come with salt packets premeasured to use to make saline. If you use your own salt, make sure it is kosher salt or sea salt (don't use table salt as it has iodine in it and you don't need that in your nose). Use distilled water to make saline. If you mix your own saline using your own salt, the recipe is 1/4 teaspoon salt in 1 cup warm water. Using saline irrigation can help prevent and treat sinus infections.    If you have been instructed to have an in-person evaluation today at a local Urgent Care facility, please use the link below.  It will take you to a list of all of our available [facility], including address, phone number and hours of operation. Please do not delay care.  [facility]  If you or a family member do not have a primary care provider, use the link below to schedule a visit and establish care. When you choose a [facility] primary care physician or advanced practice provider, you gain a long-term partner in health. Find a Primary Care Provider  Learn more about [facility]'s in-office and virtual care options:  - Get Care Now

## 2023-10-01 ENCOUNTER — Encounter: Payer: Self-pay | Admitting: Podiatry

## 2023-10-01 ENCOUNTER — Ambulatory Visit (INDEPENDENT_AMBULATORY_CARE_PROVIDER_SITE_OTHER): Payer: Medicaid Other | Admitting: Podiatry

## 2023-10-01 DIAGNOSIS — L600 Ingrowing nail: Secondary | ICD-10-CM

## 2023-10-01 NOTE — Progress Notes (Signed)
   Chief Complaint  Patient presents with   Ingrown Toenail    Patient states she has had a ingrown removed from her left foot hallux 10 years ago but it grows back in the the inner corner of her left hallux. Patient also states she has an ingrown toe nail on her 2nd toe left foot.  No medication for pain.     HPI: 53 y.o. female presenting today for evaluation of a nail spicule that continues to grow to the left great toe lateral border.  Patient has a history of total permanent nail avulsion to the left great toe nail plate about 10 years ago.  She says that the corner of the nail continues to regrow and every few months it becomes symptomatic and she has to pull it out.  She would like to have it removed Patient also has developed dystrophic thickened nail to the left second toe concerning for chronic toenail fungus.  She says it is completely disfigured and she would also like to have it completely removed.  Patient is nervous to have it removed today and she did not realize that we would offer to have it removed today.  She would like to come back another day to proceed with the procedure  Past Medical History:  Diagnosis Date   Anxiety    Emphysema lung (HCC)    Hyperlipidemia     Past Surgical History:  Procedure Laterality Date   ABDOMINAL HYSTERECTOMY     bilateral inguinal hernia repair     COLONOSCOPY WITH PROPOFOL N/A 08/08/2022   Procedure: COLONOSCOPY WITH PROPOFOL;  Surgeon: Lanelle Bal, DO;  [facility];  Service: Endoscopy;  Laterality: N/A;  1:00pm, asa 2    No Known Allergies   Physical Exam: General: The patient is alert and oriented x3 in no acute distress.  Dermatology: Skin is warm, dry and supple bilateral lower extremities.  Absence of the left hallux nail plate noted with recurrence of the lateral border of the left great toenail consistent with recurrent nail spicule Dystrophic toenail noted to the left second toe with associated tenderness  with palpation around the distal tip of the toe  Vascular: Palpable pedal pulses bilaterally. Capillary refill within normal limits.  No appreciable edema.  No erythema.  Neurological: Grossly intact via light touch  Musculoskeletal Exam: No pedal deformities noted  Assessment/Plan of Care: 1.  History of total permanent nail avulsion left hallux nail plate with recurrent nail spicule lateral border of the left great toe 2.  Symptomatic onychomycosis of the left second toe with nail dystrophy  -Patient evaluated -Today we discussed the nail avulsion procedure which she is familiar with.  She has had this performed in the past.  She would like to be able to reschedule and return with family/friend when she has the procedure performed. -Patient will make an appointment at checkout for the procedure to be performed       Felecia Shelling, DPM [facility]  Dr. Felecia Shelling, DPM    [address] [place], [place] [address]                Office [phone]  Fax [phone]

## 2023-10-08 ENCOUNTER — Ambulatory Visit: Payer: Medicaid Other | Admitting: Podiatry

## 2023-10-26 ENCOUNTER — Other Ambulatory Visit: Payer: Self-pay | Admitting: Family Medicine

## 2023-10-26 MED ORDER — ALPRAZOLAM 0.5 MG PO TABS: 0.5000 mg | ORAL_TABLET | Freq: Two times a day (BID) | ORAL | 0 refills | Status: DC | PRN

## 2023-10-31 ENCOUNTER — Institutional Professional Consult (permissible substitution): Payer: Medicaid Other | Admitting: Pulmonary Disease

## 2023-12-02 ENCOUNTER — Other Ambulatory Visit: Payer: Self-pay | Admitting: Family Medicine

## 2023-12-03 ENCOUNTER — Ambulatory Visit: Payer: Medicaid Other | Admitting: Adult Health

## 2023-12-04 ENCOUNTER — Encounter: Payer: Self-pay | Admitting: Adult Health

## 2023-12-04 ENCOUNTER — Ambulatory Visit (INDEPENDENT_AMBULATORY_CARE_PROVIDER_SITE_OTHER): Admitting: Adult Health

## 2023-12-04 VITALS — BP 138/85 | HR 79 | Ht 66.0 in | Wt 149.5 lb

## 2023-12-04 DIAGNOSIS — Z90710 Acquired absence of both cervix and uterus: Secondary | ICD-10-CM

## 2023-12-04 DIAGNOSIS — Z79818 Long term (current) use of other agents affecting estrogen receptors and estrogen levels: Secondary | ICD-10-CM

## 2023-12-04 DIAGNOSIS — Z90721 Acquired absence of ovaries, unilateral: Secondary | ICD-10-CM

## 2023-12-04 DIAGNOSIS — N3946 Mixed incontinence: Secondary | ICD-10-CM

## 2023-12-04 NOTE — Progress Notes (Signed)
  Subjective:     Patient ID: Lacey Woodard, female   DOB: 1971-02-14, 53 y.o.   MRN: [id number]  HPI Lacey Woodard is a 53 year old white female, divorced, sp hysterectomy, back in follow up on taking vesicare and is some better, and hot flashes better, is taking estrace 1 mg.  PCP is I Polanco FNP   Review of Systems Hot flashes better Still has UI Reviewed past medical,surgical, social and family history. Reviewed medications and allergies.     Objective:   Physical Exam BP 138/85 (BP Location: Left Arm, Patient Position: Sitting, Cuff Size: Normal)   Pulse 79   Ht 5\' 6"  (1.676 m)   Wt 149 lb 8 oz (67.8 kg)   BMI 24.13 kg/m     Skin warm and dry.  Lungs: clear to ausculation bilaterally. Cardiovascular: regular rate and rhythm.  Fall risk is low  Upstream - 12/04/23 1112       Pregnancy Intention Screening   Does the patient want to become pregnant in the next year? N/A    Does the patient's partner want to become pregnant in the next year? N/A    Would the patient like to discuss contraceptive options today? N/A      Contraception Wrap Up   Current Method Female Sterilization   hyst   End Method Female Sterilization   hyst   Contraception Counseling Provided No             Assessment:     1. Current use of estrogen therapy (Primary) Happy with estrace is taking 1 mg has refills   2. S/P hysterectomy with oophorectomy  3. Mixed stress and urge urinary incontinence Vesicare 10 mg has helped, has refills, but would like referral to urology  Referral made to [facility]  - Ambulatory referral to Urology     Plan:     Follow up in 6 months or sooner if needed

## 2023-12-11 ENCOUNTER — Encounter: Payer: Self-pay | Admitting: Urology

## 2023-12-11 ENCOUNTER — Ambulatory Visit (INDEPENDENT_AMBULATORY_CARE_PROVIDER_SITE_OTHER): Admitting: Urology

## 2023-12-11 VITALS — BP 111/72 | HR 80 | Temp 98.2°F

## 2023-12-11 DIAGNOSIS — N3946 Mixed incontinence: Secondary | ICD-10-CM | POA: Diagnosis not present

## 2023-12-11 DIAGNOSIS — N393 Stress incontinence (female) (male): Secondary | ICD-10-CM | POA: Diagnosis not present

## 2023-12-11 LAB — POCT URINALYSIS DIPSTICK
Bilirubin, UA: NEGATIVE
Blood, UA: NEGATIVE
Glucose, UA: NEGATIVE
Ketones, UA: NEGATIVE
Leukocytes, UA: NEGATIVE
Nitrite, UA: NEGATIVE
Protein, UA: NEGATIVE
Spec Grav, UA: 1.02 (ref 1.010–1.025)
Urobilinogen, UA: 0.2 U/dL
pH, UA: 8 (ref 5.0–8.0)

## 2023-12-11 LAB — BLADDER SCAN AMB NON-IMAGING: Scan Result: 0

## 2023-12-11 NOTE — Progress Notes (Signed)
 post void residual=0 ?

## 2023-12-11 NOTE — Progress Notes (Signed)
 Chief Complaint: No chief complaint on file.   History of Present Illness:  Lacey Woodard is a 53 y.o. female who is seen  for evaluation of urinary incontinence.  The patient is sent by her gynecologic provider, Cyril Mourning.  She has had stress urinary incontinence for 4 to 5 years, symptoms have been getting much worse over the past 4 to 5 months.  She has a good stream and feels like she empties well.  She denies significant urgency or urgency incontinence.  Mainly leakage with coughing sneezing, any significant physical activity, bending over.  She wears pads, changes these 4-5 times a day.  Almost never saturated, usually damp.  She does not have urinary symptoms at night.  She does not think she leaks with intercourse.   Past Medical History:  Past Medical History:  Diagnosis Date   Acid reflux    Anxiety    Depression    Emphysema lung (HCC)    Hyperlipidemia     Past Surgical History:  Past Surgical History:  Procedure Laterality Date   ABDOMINAL HYSTERECTOMY     bilateral inguinal hernia repair     COLONOSCOPY WITH PROPOFOL N/A 08/08/2022   Procedure: COLONOSCOPY WITH PROPOFOL;  Surgeon: Lanelle Bal, DO;  [facility];  Service: Endoscopy;  Laterality: N/A;  1:00pm, asa 2    Allergies:  No Known Allergies  Family History:  Family History  Problem Relation Age of Onset   Colon cancer Maternal Aunt     Social History:  Social History   Tobacco Use   Smoking status: Every Day    Current packs/day: 1.00    Average packs/day: 1 pack/day for 35.3 years (35.3 ttl pk-yrs)    Types: Cigarettes    Start date: 71   Smokeless tobacco: Never  Vaping Use   Vaping status: Never Used  Substance Use Topics   Alcohol use: No   Drug use: No    Review of symptoms:  Constitutional:  Negative for unexplained weight loss, night sweats, fever, chills ENT:  Negative for nose bleeds, sinus pain, painful swallowing CV:  Negative for chest pain,  shortness of breath, exercise intolerance, palpitations, loss of consciousness Resp:  Negative for cough, wheezing, shortness of breath GI:  Negative for nausea, vomiting, diarrhea, bloody stools GU:  Positives noted in HPI; otherwise negative for gross hematuria, dysuria, urinary incontinence Neuro:  Negative for seizures, poor balance, limb weakness, slurred speech Psych:  Negative for lack of energy, depression, anxiety Endocrine:  Negative for polydipsia, polyuria, symptoms of hypoglycemia (dizziness, hunger, sweating) Hematologic:  Negative for anemia, purpura, petechia, prolonged or excessive bleeding, use of anticoagulants  Allergic:  Negative for difficulty breathing or choking as a result of exposure to anything; no shellfish allergy; no allergic response (rash/itch) to materials, foods  Physical exam: BP 111/72   Pulse 80   Temp 98.2 F (36.8 C)  GENERAL APPEARANCE:  Well appearing, well developed, well nourished, NAD HEENT: Atraumatic, Normocephalic, oropharynx clear. NECK: Supple without lymphadenopathy or thyromegaly. LUNGS: Clear to auscultation bilaterally. HEART: Regular Rate and Rhythm without murmurs, gallops, or rubs. ABDOMEN: Mildly obese. GU: Normal external genitalia.  Uterus normal.  Grade 1 cystocele.  Hypermobility of urethra with Valsalva.  Minimal leakage with Valsalva. EXTREMITIES: Moves all extremities well.  Without clubbing, cyanosis, or edema. NEUROLOGIC:  Alert and oriented x 3, normal gait, CN II-XII grossly intact.  MENTAL STATUS:  Appropriate. BACK:  Non-tender to palpation.  No CVAT SKIN:  Warm,  dry and intact.    Results: Results for orders placed or performed in visit on 12/11/23 (from the past 24 hours)  BLADDER SCAN AMB NON-IMAGING   Collection Time: 12/11/23  8:38 AM  Result Value Ref Range   Scan Result 0   POCT urinalysis dipstick   Collection Time: 12/11/23  8:55 AM  Result Value Ref Range   Color, UA yellow    Clarity, UA clear     Glucose, UA Negative Negative   Bilirubin, UA negative    Ketones, UA negative    Spec Grav, UA 1.020 1.010 - 1.025   Blood, UA negaitve    pH, UA 8.0 5.0 - 8.0   Protein, UA Negative Negative   Urobilinogen, UA 0.2 0.2 or 1.0 E.U./dL   Nitrite, UA negaitve    Leukocytes, UA Negative Negative   Appearance     Odor      I have reviewed prior patient's records  I have reviewed referring/prior physicians records-data gynecologist records  I have reviewed urinalysis--clear   Assessment: Stress urinary incontinence, bothersome to patient  By her history, she denies significant urinary urgency but she is on Vesicare   Plan: I think it is okay for her to come off of her Vesicare  I discussed further management of stress incontinence with her-I would recommend starting with physical therapy, but she would like to see Dr. Claretta Croft to discuss sling procedure.  Will get that appointment set up.

## 2023-12-31 ENCOUNTER — Other Ambulatory Visit: Payer: Self-pay | Admitting: Family Medicine

## 2024-01-01 ENCOUNTER — Other Ambulatory Visit: Payer: Self-pay | Admitting: Family Medicine

## 2024-01-01 MED ORDER — ALPRAZOLAM 0.5 MG PO TABS: 0.5000 mg | ORAL_TABLET | Freq: Every day | ORAL | 0 refills | Status: DC

## 2024-01-01 MED ORDER — ESCITALOPRAM OXALATE 20 MG PO TABS: 20.0000 mg | ORAL_TABLET | Freq: Every day | ORAL | 3 refills | Status: DC

## 2024-01-02 ENCOUNTER — Ambulatory Visit: Admitting: Pulmonary Disease

## 2024-01-02 ENCOUNTER — Encounter: Payer: Self-pay | Admitting: Pulmonary Disease

## 2024-01-02 VITALS — BP 118/74 | HR 89 | Temp 98.9°F | Ht 66.0 in | Wt 151.6 lb

## 2024-01-02 DIAGNOSIS — R0609 Other forms of dyspnea: Secondary | ICD-10-CM

## 2024-01-02 DIAGNOSIS — J439 Emphysema, unspecified: Secondary | ICD-10-CM

## 2024-01-02 DIAGNOSIS — R002 Palpitations: Secondary | ICD-10-CM

## 2024-01-02 DIAGNOSIS — F17210 Nicotine dependence, cigarettes, uncomplicated: Secondary | ICD-10-CM | POA: Diagnosis not present

## 2024-01-02 MED ORDER — STIOLTO RESPIMAT 2.5-2.5 MCG/ACT IN AERS: 2.0000 | INHALATION_SPRAY | Freq: Every day | RESPIRATORY_TRACT | 11 refills | Status: AC

## 2024-01-02 NOTE — Patient Instructions (Signed)
 Nice to meet you  You Stiolto 2 puffs once a day, see if this helps with your shortness of breath.  For a refresher on how to use it, go to YouTube and search "how to use Respimat [employer]"  Will get pulmonary function test to further evaluate your shortness of breath.  Return to clinic in 3 months with Dr. Marygrace Snellen, pulmonary function test same day prior to office visit

## 2024-01-03 NOTE — Progress Notes (Signed)
 @Patient  ID: Lacey Woodard, female    DOB: Mar 06, 1971, 53 y.o.   MRN: [id number]  Chief Complaint  Patient presents with   Consult    Pulmonary consult.  SOB with exertion x 5 months    Referring provider: Waneta Gut*  HPI:   53 y.o. woman whom are seen for evaluation of dyspnea on exertion.  Most recent PCP note reviewed.  She reports onset of shortness of breath for the last 5 to 6 months.  With exertion.  At rest she is fine.  Triggered with stairs or inclines.  Or carrying things.  No time of day when things are better or worse.  No position to make these better worse.  No seasonal or environmental factors she can identify to make things better or worse.  No alleviating or exacerbating factors other than rest.  She smokes 20 to 30 cigarettes a day gestational days.  She would like to cut back but is not sure if she is really ready right now.  Several stressors.  In addition to the dyspnea she reports onset of what sound like palpitations.  She feels like her heart is fluttering.  Her heartbeat is fast.  Also this occurs at rest.  Less frequently with exertion.  She is taking a Xanax  and this does not help the sensation.  She had a low-dose CT scan for lung cancer screening 07/2023 on my read interpretation reveals significant, severe emphysematous changes with a large bleb at the right apex versus possible chronic loculated pneumothorax.  Questionaires / Pulmonary Flowsheets:   ACT:      No data to display          MMRC:     No data to display          Epworth:      No data to display          Tests:   FENO:  No results found for: "NITRICOXIDE"  PFT:     No data to display          WALK:      No data to display          Imaging: Personally reviewed and as per EMR and discussion in this note No results found.  Lab Results: Personally reviewed CBC    Component Value Date/Time   WBC 7.7 07/04/2023 1004   WBC 9.5 08/17/2013  2330   RBC 4.71 07/04/2023 1004   RBC 4.41 08/17/2013 2330   HGB 15.1 07/04/2023 1004   HCT 45.4 07/04/2023 1004   PLT 274 07/04/2023 1004   MCV 96 07/04/2023 1004   MCH 32.1 07/04/2023 1004   MCH 31.7 08/17/2013 2330   MCHC 33.3 07/04/2023 1004   MCHC 33.9 08/17/2013 2330   RDW 12.4 07/04/2023 1004   LYMPHSABS 2.9 07/04/2023 1004   MONOABS 0.6 08/17/2013 2330   EOSABS 0.2 07/04/2023 1004   BASOSABS 0.0 07/04/2023 1004    BMET    Component Value Date/Time   NA 143 07/04/2023 1004   K 4.4 07/04/2023 1004   CL 105 07/04/2023 1004   CO2 22 07/04/2023 1004   GLUCOSE 88 07/04/2023 1004   GLUCOSE 92 08/17/2013 2330   BUN 9 07/04/2023 1004   CREATININE 1.04 (H) 07/04/2023 1004   CALCIUM  9.4 07/04/2023 1004   GFRNONAA >90 08/17/2013 2330   GFRAA >90 08/17/2013 2330    BNP No results found for: "BNP"  ProBNP No results found for: "PROBNP"  Specialty Problems       Pulmonary Problems   Emphysema lung (HCC)    No Known Allergies   There is no immunization history on file for this patient.  Past Medical History:  Diagnosis Date   Acid reflux    Anxiety    Depression    Emphysema lung (HCC)    Hyperlipidemia     Tobacco History: Social History   Tobacco Use  Smoking Status Every Day   Current packs/day: 1.00   Average packs/day: 1 pack/day for 35.3 years (35.3 ttl pk-yrs)   Types: Cigarettes   Start date: 1990  Smokeless Tobacco Never   Ready to quit: Not Answered Counseling given: Not Answered   Continue to not smoke  Outpatient Encounter Medications as of 01/02/2024  Medication Sig   albuterol  (VENTOLIN  HFA) 108 (90 Base) MCG/ACT inhaler Inhale 2 puffs into the lungs every 6 (six) hours as needed for wheezing or shortness of breath.   ALPRAZolam  (XANAX ) 0.5 MG tablet Take 1 tablet (0.5 mg total) by mouth 2 (two) times daily as needed for anxiety or sleep.   ALPRAZolam  (XANAX ) 0.5 MG tablet Take 1 tablet (0.5 mg total) by mouth at bedtime.    Cholecalciferol (VITAMIN D3) 25 MCG (1000 UT) CAPS Take 1,000 Units by mouth daily.   cyclobenzaprine (FLEXERIL) 5 MG tablet SMARTSIG:1 Tablet(s) By Mouth 1-3 Times Daily   escitalopram  (LEXAPRO ) 20 MG tablet Take 1 tablet (20 mg total) by mouth daily.   estradiol  (ESTRACE ) 2 MG tablet Take 1 tablet (2 mg total) by mouth daily.   fluticasone (FLONASE) 50 MCG/ACT nasal spray Place 2 sprays into both nostrils daily.   gabapentin  (NEURONTIN ) 300 MG capsule Take 1 capsule (300 mg total) by mouth 2 (two) times daily as needed.   magnesium oxide (MAG-OX) 400 (240 Mg) MG tablet Take 400 mg by mouth at bedtime.   ondansetron  (ZOFRAN -ODT) 4 MG disintegrating tablet Take 4 mg by mouth every 8 (eight) hours as needed.   solifenacin  (VESICARE ) 10 MG tablet Take 1 tablet (10 mg total) by mouth daily.   Tiotropium Bromide-Olodaterol (STIOLTO RESPIMAT) 2.5-2.5 MCG/ACT AERS Inhale 2 puffs into the lungs daily.   No facility-administered encounter medications on file as of 01/02/2024.     Review of Systems  Review of Systems  No chest pain with exertion.  No orthopnea or PND.  Comprehensive review of systems otherwise negative. Physical Exam  BP 118/74 (BP Location: Right Arm, Patient Position: Sitting, Cuff Size: Normal)   Pulse 89   Temp 98.9 F (37.2 C) (Oral)   Ht 5\' 6"  (1.676 m)   Wt 151 lb 9.6 oz (68.8 kg)   SpO2 98%   BMI 24.47 kg/m   Wt Readings from Last 5 Encounters:  01/02/24 151 lb 9.6 oz (68.8 kg)  12/04/23 149 lb 8 oz (67.8 kg)  09/04/23 148 lb 8 oz (67.4 kg)  08/20/23 146 lb 1.3 oz (66.3 kg)  06/25/23 141 lb 1.9 oz (64 kg)    BMI Readings from Last 5 Encounters:  01/02/24 24.47 kg/m  12/04/23 24.13 kg/m  09/04/23 23.97 kg/m  08/20/23 23.58 kg/m  06/25/23 22.78 kg/m     Physical Exam General: Sitting in chair, in no acute distress Eyes: EOMI, no icterus Neck: Supple, no JVP Pulmonary: Clear, normal work of breathing Abdomen: Nondistended, bowel sounds present CV:  Regular rate and rhythm, no murmur Abdomen: Nondistended, bowel sounds present MSK: No synovitis, no joint effusion Neuro: Normal gait, no weakness Psych:  Normal mood, full affect   Assessment & Plan:   Dyspnea on exertion: Suspect largely driven by emphysema seen on CT scan 07/2023.  She also has palpitations, cardiac etiologies are possible.  She denies any chest pain etc.  PFTs for further evaluation.  Trial of Stiolto.  Palpitations: Symptomatic.  Occurs at rest.  Sometimes with exertion as well.  Referral to cardiology for further evaluation.  Arrhythmia could be possible, could be contributing to dyspnea as above.  Smoking assessment and cessation counseling I have advised the patient to quit/stop smoking as soon as possible due to high risk for multiple medical problems.  It will also be very difficult for us  to manage patient's  respiratory symptoms and status if we continue to expose her lungs to a known irritant.  We do not advise e-cigarettes as a form of stopping smoking. Patient is not willing to quit smoking. I have advised the patient that we can assist and have options of nicotine replacement therapy, provided smoking cessation education today, provided smoking cessation counseling, and provided cessation resources. Follow-up next office visit office visit for assessment of smoking cessation.  She has had adverse reaction to Wellbutrin in the past, would avoid this.  I spent 4 minutes in smoking cessation counseling.   Return in about 3 months (around 04/03/2024) for f/u Dr. Marygrace Snellen, after PFT same day prior to OV.   Guerry Leek, MD 01/03/2024   This appointment required 65 minutes of patient care (this includes precharting, chart review, review of results, face-to-face care, etc.).

## 2024-01-09 ENCOUNTER — Encounter: Payer: Self-pay | Admitting: Internal Medicine

## 2024-01-09 ENCOUNTER — Ambulatory Visit: Attending: Internal Medicine | Admitting: Internal Medicine

## 2024-01-09 VITALS — BP 116/74 | HR 97 | Ht 66.0 in | Wt 150.0 lb

## 2024-01-09 DIAGNOSIS — R0609 Other forms of dyspnea: Secondary | ICD-10-CM | POA: Diagnosis present

## 2024-01-09 DIAGNOSIS — Z720 Tobacco use: Secondary | ICD-10-CM | POA: Diagnosis present

## 2024-01-09 DIAGNOSIS — R002 Palpitations: Secondary | ICD-10-CM | POA: Diagnosis present

## 2024-01-09 DIAGNOSIS — E785 Hyperlipidemia, unspecified: Secondary | ICD-10-CM | POA: Diagnosis present

## 2024-01-09 NOTE — Progress Notes (Signed)
 Cardiology Office Note  Date: 01/09/2024   ID: Lacey Woodard, DOB [birth year], MRN [id number]  PCP:  Rosanna Comment, FNP  Cardiologist:  None Electrophysiologist:  None   History of Present Illness: Lacey Woodard is a 53 y.o. female known to have COPD was referred to cardiology clinic for evaluation of DOE.  Ongoing DOE for the last 6 months.  No orthopnea, leg swelling.  She also has daily exertional palpitations and multiple times throughout the day.  She does not feel her heart is skipping a beat.  No dizziness, syncope.  Imaging showed severe COPD, severe emphysematous changes.  She is currently seeing pulmonology.  Past Medical History:  Diagnosis Date   Acid reflux    Anxiety    Depression    Emphysema lung (HCC)    Hyperlipidemia     Past Surgical History:  Procedure Laterality Date   ABDOMINAL HYSTERECTOMY     bilateral inguinal hernia repair     COLONOSCOPY WITH PROPOFOL  N/A 08/08/2022   Procedure: COLONOSCOPY WITH PROPOFOL ;  Surgeon: Vinetta Greening, DO;  Location: [facility];  Service: Endoscopy;  Laterality: N/A;  1:00pm, asa 2    Current Outpatient Medications  Medication Sig Dispense Refill   albuterol  (VENTOLIN  HFA) 108 (90 Base) MCG/ACT inhaler Inhale 2 puffs into the lungs every 6 (six) hours as needed for wheezing or shortness of breath. 8 g 0   ALPRAZolam  (XANAX ) 0.5 MG tablet Take 1 tablet (0.5 mg total) by mouth 2 (two) times daily as needed for anxiety or sleep. 45 tablet 0   ALPRAZolam  (XANAX ) 0.5 MG tablet Take 1 tablet (0.5 mg total) by mouth at bedtime. 30 tablet 0   Cholecalciferol (VITAMIN D3) 25 MCG (1000 UT) CAPS Take 1,000 Units by mouth daily.     cyclobenzaprine (FLEXERIL) 5 MG tablet SMARTSIG:1 Tablet(s) By Mouth 1-3 Times Daily     escitalopram  (LEXAPRO ) 20 MG tablet Take 1 tablet (20 mg total) by mouth daily. 30 tablet 3   estradiol  (ESTRACE ) 2 MG tablet Take 1 tablet (2 mg total) by mouth daily. 30 tablet 6    fluticasone (FLONASE) 50 MCG/ACT nasal spray Place 2 sprays into both nostrils daily.     gabapentin  (NEURONTIN ) 300 MG capsule Take 1 capsule (300 mg total) by mouth 2 (two) times daily as needed. 90 capsule 3   magnesium oxide (MAG-OX) 400 (240 Mg) MG tablet Take 400 mg by mouth at bedtime.     ondansetron  (ZOFRAN -ODT) 4 MG disintegrating tablet Take 4 mg by mouth every 8 (eight) hours as needed.     solifenacin  (VESICARE ) 10 MG tablet Take 1 tablet (10 mg total) by mouth daily. 30 tablet 6   Tiotropium Bromide-Olodaterol (STIOLTO RESPIMAT ) 2.5-2.5 MCG/ACT AERS Inhale 2 puffs into the lungs daily. 1 each 11   No current facility-administered medications for this visit.   Allergies:  Patient has no known allergies.   Social History: The patient  reports that she has been smoking cigarettes. She started smoking about 35 years ago. She has a 35.4 pack-year smoking history. She has never used smokeless tobacco. She reports that she does not drink alcohol and does not use drugs.   Family History: The patient's family history includes Colon cancer in her maternal aunt.   ROS:  Please see the history of present illness. Otherwise, complete review of systems is positive for none.  All other systems are reviewed and negative.   Physical Exam: VS:  Ht 5'  6" (1.676 m)   Wt 150 lb (68 kg)   BMI 24.21 kg/m , BMI Body mass index is 24.21 kg/m.  Wt Readings from Last 3 Encounters:  01/09/24 150 lb (68 kg)  01/02/24 151 lb 9.6 oz (68.8 kg)  12/04/23 149 lb 8 oz (67.8 kg)    General: Patient appears comfortable at rest. HEENT: Conjunctiva and lids normal, oropharynx clear with moist mucosa. Neck: Supple, no elevated JVP or carotid bruits, no thyromegaly. Lungs: Clear to auscultation, nonlabored breathing at rest. Cardiac: Regular rate and rhythm, no S3 or significant systolic murmur, no pericardial rub. Abdomen: Soft, nontender, no hepatomegaly, bowel sounds present, no guarding or  rebound. Extremities: No pitting edema, distal pulses 2+. Skin: Warm and dry. Musculoskeletal: No kyphosis. Neuropsychiatric: Alert and oriented x3, affect grossly appropriate.  Recent Labwork: 07/04/2023: ALT 24; AST 24; BUN 9; Creatinine, Ser 1.04; Hemoglobin 15.1; Platelets 274; Potassium 4.4; Sodium 143; TSH 2.670     Component Value Date/Time   CHOL 232 (H) 07/04/2023 1004   TRIG 125 07/04/2023 1004   HDL 93 07/04/2023 1004   CHOLHDL 2.5 07/04/2023 1004   LDLCALC 118 (H) 07/04/2023 1004     Assessment and Plan:  DOE: Ongoing DOE for the last 6 months.  No orthopnea, leg swelling.  Imaging showed severe emphysematous changes.  Follows up with pulmonology.  Obtain echocardiogram and exercise Myoview.  Palpitations: Ongoing palpitations daily with exertion and multiple times throughout the day.  Obtain 1 week event monitor.  Exercise Myoview will give further information about any exertional arrhythmias.  She also gets chest tightness associated with palpitations.  Likely from palpitations.  Nicotine abuse: Currently smokes more than 1 pack/day.  Has been smoking since she was 53 years old.  Counseling provided.   Medication Adjustments/Labs and Tests Ordered: Current medicines are reviewed at length with the patient today.  Concerns regarding medicines are outlined above.    Disposition:  Follow up pending results.  Signed, Farley Crooker Priya Haddy Mullinax, MD, 01/09/2024 3:35 PM    [facility] [address] [place], [place] [address]

## 2024-01-09 NOTE — Patient Instructions (Signed)
 Medication Instructions:  Your physician recommends that you continue on your current medications as directed. Please refer to the Current Medication list given to you today.  *If you need a refill on your cardiac medications before your next appointment, please call your pharmacy*  Lab Work: None If you have labs (blood work) drawn today and your tests are completely normal, you will receive your results only by: MyChart Message (if you have MyChart) OR A paper copy in the mail If you have any lab test that is abnormal or we need to change your treatment, we will call you to review the results.  Testing/Procedures: Your physician has requested that you have an echocardiogram. Echocardiography is a painless test that uses sound waves to create images of your heart. It provides your doctor with information about the size and shape of your heart and how well your heart's chambers and valves are working. This procedure takes approximately one hour. There are no restrictions for this procedure. Please do NOT wear cologne, perfume, aftershave, or lotions (deodorant is allowed). Please arrive 15 minutes prior to your appointment time.  Please note: We ask at that you not bring children with you during ultrasound (echo/ vascular) testing. Due to room size and safety concerns, children are not allowed in the ultrasound rooms during exams. Our front office staff cannot provide observation of children in our lobby area while testing is being conducted. An adult accompanying a patient to their appointment will only be allowed in the ultrasound room at the discretion of the ultrasound technician under special circumstances. We apologize for any inconvenience.  Your physician has requested that you have en exercise stress myoview. For further information please visit [email or web address] Please follow instruction sheet, as given.   Follow-Up: At [facility], you and your health needs are our  priority.  As part of our continuing mission to provide you with exceptional heart care, our providers are all part of one team.  This team includes your primary Cardiologist (physician) and Advanced Practice Providers or APPs (Physician Assistants and Nurse Practitioners) who all work together to provide you with the care you need, when you need it.  Your next appointment:   Follow up is pending testing results  Provider:   You may see Vishnu P Mallipeddi, MD or one of the following Advanced Practice Providers on your designated Care Team:   [place], PA-C  [place], [place] Theotis Flake, [place]     We recommend signing up for the patient portal called "MyChart".  Sign up information is provided on this After Visit Summary.  MyChart is used to connect with patients for Virtual Visits (Telemedicine).  Patients are able to view lab/test results, encounter notes, upcoming appointments, etc.  Non-urgent messages can be sent to your provider as well.   To learn more about what you can do with MyChart, go to [email or web address].   Other Instructions ZIO XT- Long Term Monitor Instructions   Your physician has requested you wear your ZIO patch monitor__7_____days.   This is a single patch monitor.  [employer] supplies one patch monitor per enrollment.  Additional stickers are not available.   Please do not apply patch if you will be having a Nuclear Stress Test, Echocardiogram, Cardiac CT, MRI, or Chest Xray during the time frame you would be wearing the monitor. The patch cannot be worn during these tests.  You cannot remove and re-apply the ZIO XT patch monitor.   Your ZIO patch monitor will be sent  USPS Priority mail from [employer] directly to your home address. The monitor may also be mailed to a PO BOX if home delivery is not available.   It may take 3-5 days to receive your monitor after you have been enrolled.   Once you have received you monitor, please review  enclosed instructions.  Your monitor has already been registered assigning a specific monitor serial # to you.   Applying the monitor   Shave hair from upper left chest.   Hold abrader disc by orange tab.  Rub abrader in 40 strokes over left upper chest as indicated in your monitor instructions.   Clean area with 4 enclosed alcohol pads .  Use all pads to assure are is cleaned thoroughly.  Let dry.   Apply patch as indicated in monitor instructions.  Patch will be place under collarbone on left side of chest with arrow pointing upward.   Rub patch adhesive wings for 2 minutes.Remove white label marked "1".  Remove white label marked "2".  Rub patch adhesive wings for 2 additional minutes.   While looking in a mirror, press and release button in center of patch.  A small green light will flash 3-4 times .  This will be your only indicator the monitor has been turned on.     Do not shower for the first 24 hours.  You may shower after the first 24 hours.   Press button if you feel a symptom. You will hear a small click.  Record Date, Time and Symptom in the Patient Log Book.   When you are ready to remove patch, follow instructions on last 2 pages of Patient Log Book.  Stick patch monitor onto last page of Patient Log Book.   Place Patient Log Book in [place] box.  Use locking tab on box and tape box closed securely.  The [employer] has [employer] on it.  Please place in mailbox as soon as possible.  Your physician should have your test results approximately 7 days after the monitor has been mailed back to [facility].   Call [facility] Customer Care at [phone] if you have questions regarding your ZIO XT patch monitor.  Call them immediately if you see an orange light blinking on your monitor.   If your monitor falls off in less than 4 days contact our Monitor department at [phone].  If your monitor becomes loose or falls off after 4 days call [employer] at  [phone] for suggestions on securing your monitor.

## 2024-01-14 ENCOUNTER — Encounter: Payer: Self-pay | Admitting: Internal Medicine

## 2024-01-15 ENCOUNTER — Ambulatory Visit: Attending: Internal Medicine

## 2024-01-15 DIAGNOSIS — R002 Palpitations: Secondary | ICD-10-CM

## 2024-01-23 ENCOUNTER — Encounter (INDEPENDENT_AMBULATORY_CARE_PROVIDER_SITE_OTHER): Admitting: Urology

## 2024-01-23 VITALS — BP 105/69 | HR 88

## 2024-01-23 DIAGNOSIS — N3946 Mixed incontinence: Secondary | ICD-10-CM

## 2024-01-23 LAB — URINALYSIS, ROUTINE W REFLEX MICROSCOPIC
Bilirubin, UA: NEGATIVE
Glucose, UA: NEGATIVE
Ketones, UA: NEGATIVE
Leukocytes,UA: NEGATIVE
Nitrite, UA: NEGATIVE
Protein,UA: NEGATIVE
RBC, UA: NEGATIVE
Specific Gravity, UA: 1.02 (ref 1.005–1.030)
Urobilinogen, Ur: 0.2 mg/dL (ref 0.2–1.0)
pH, UA: 7 (ref 5.0–7.5)

## 2024-01-28 ENCOUNTER — Encounter (HOSPITAL_COMMUNITY): Admission: RE | Admit: 2024-01-28 | Source: Ambulatory Visit

## 2024-01-28 ENCOUNTER — Encounter (HOSPITAL_COMMUNITY)

## 2024-01-29 NOTE — Progress Notes (Signed)
 error

## 2024-02-04 ENCOUNTER — Ambulatory Visit (INDEPENDENT_AMBULATORY_CARE_PROVIDER_SITE_OTHER): Admitting: Urology

## 2024-02-04 ENCOUNTER — Encounter (HOSPITAL_COMMUNITY)

## 2024-02-04 ENCOUNTER — Other Ambulatory Visit (HOSPITAL_COMMUNITY)

## 2024-02-04 VITALS — BP 125/80 | HR 89 | Ht 66.0 in | Wt 151.1 lb

## 2024-02-04 DIAGNOSIS — R002 Palpitations: Secondary | ICD-10-CM

## 2024-02-04 DIAGNOSIS — N3946 Mixed incontinence: Secondary | ICD-10-CM | POA: Diagnosis not present

## 2024-02-04 LAB — MICROSCOPIC EXAMINATION: Epithelial Cells (non renal): >10 /HPF — AB (ref 0–10)

## 2024-02-04 LAB — URINALYSIS, COMPLETE
Bilirubin, UA: NEGATIVE
Glucose, UA: NEGATIVE
Ketones, UA: NEGATIVE
Leukocytes,UA: NEGATIVE
Nitrite, UA: NEGATIVE
Protein,UA: NEGATIVE
RBC, UA: NEGATIVE
Specific Gravity, UA: <1.005 — ABNORMAL LOW (ref 1.005–1.030)
Urobilinogen, Ur: 0.2 mg/dL (ref 0.2–1.0)
pH, UA: 6.5 (ref 5.0–7.5)

## 2024-02-04 NOTE — Progress Notes (Addendum)
 02/04/2024 8:39 AM   Lacey Woodard 17-Feb-1971 [id number]  Referring provider: Rosanna Comment, FNP [phone] S. [address] [place],  [place] [address]  Chief Complaint  Patient presents with   Urinary Incontinence    HPI: I was consulted to assess the patient's urinary incontinence.  She saw one of my partners recently and has been on Vesicare  and perhaps 1 other medication in the past  Patient leaks with coughing sneezing bending lifting.  She sometimes has urge incontinence if she holds it too long.  Sometimes she has bedwetting.  She wears 5 or 6 pads a day that can be damp but sometimes more wet.  Stress incontinence is most predominant symptom  She generally voids every hour but could hold it for 2 hours.  She gets up 2-3 times a night.  Flow is reasonable and she feels empty  She has had a hysterectomy.  She has failed Vesicare  and a second medication  No history of bladder surgery urinary tract infections and bowel movements normal.  No neurologic issues   PMH: Past Medical History:  Diagnosis Date   Acid reflux    Anxiety    Depression    Emphysema lung (HCC)    Hyperlipidemia     Surgical History: Past Surgical History:  Procedure Laterality Date   ABDOMINAL HYSTERECTOMY     bilateral inguinal hernia repair     COLONOSCOPY WITH PROPOFOL  N/A 08/08/2022   Procedure: COLONOSCOPY WITH PROPOFOL ;  Surgeon: Vinetta Greening, DO;  [facility]: [facility];  Service: Endoscopy;  Laterality: N/A;  1:00pm, asa 2    Home Medications:  Allergies as of 02/04/2024   No Known Allergies      Medication List        Accurate as of February 04, 2024  8:39 AM. If you have any questions, ask your nurse or doctor.          STOP taking these medications    solifenacin  10 MG tablet Commonly known as: VESIcare        TAKE these medications    albuterol  108 (90 Base) MCG/ACT inhaler Commonly known as: VENTOLIN  HFA Inhale 2 puffs into the lungs every 6 (six)  hours as needed for wheezing or shortness of breath.   ALPRAZolam  0.5 MG tablet Commonly known as: XANAX  Take 1 tablet (0.5 mg total) by mouth at bedtime.   cyclobenzaprine 5 MG tablet Commonly known as: FLEXERIL SMARTSIG:1 Tablet(s) By Mouth 1-3 Times Daily   escitalopram  20 MG tablet Commonly known as: LEXAPRO  Take 1 tablet (20 mg total) by mouth daily.   estradiol  2 MG tablet Commonly known as: Estrace  Take 1 tablet (2 mg total) by mouth daily.   fluticasone 50 MCG/ACT nasal spray Commonly known as: FLONASE Place 2 sprays into both nostrils daily.   gabapentin  300 MG capsule Commonly known as: NEURONTIN  Take 1 capsule (300 mg total) by mouth 2 (two) times daily as needed.   magnesium oxide 400 (240 Mg) MG tablet Commonly known as: MAG-OX Take 400 mg by mouth at bedtime.   ondansetron  4 MG disintegrating tablet Commonly known as: ZOFRAN -ODT Take 4 mg by mouth every 8 (eight) hours as needed.   Stiolto Respimat  2.5-2.5 MCG/ACT Aers Generic drug: Tiotropium Bromide-Olodaterol Inhale 2 puffs into the lungs daily.   Vitamin D3 25 MCG (1000 UT) Caps Take 1,000 Units by mouth daily.        Allergies: No Known Allergies  Family History: Family History  Problem  Relation Age of Onset   Colon cancer Maternal Aunt     Social History:  reports that she has been smoking cigarettes. She started smoking about 35 years ago. She has a 35.4 pack-year smoking history. She has never used smokeless tobacco. She reports that she does not drink alcohol and does not use drugs.  ROS:                                        Physical Exam: BP 125/80   Pulse 89   Ht 5\' 6"  (1.676 m)   Wt 68.5 kg   BMI 24.39 kg/m   Constitutional:  Alert and oriented, No acute distress. HEENT: [place] AT, moist mucus membranes.  Trachea midline, no masses. Cardiovascular: No clubbing, cyanosis, or edema. Respiratory: Normal respiratory effort, no increased work of  breathing. GI: Abdomen is soft, nontender, nondistended, no abdominal masses GU: On pelvic semination patient had significant rotational descent of the urethra and somewhat of the anterior vaginal wall.  No significant cystocele.  No stress incontinence with moderate Valsalva. Skin: No rashes, bruises or suspicious lesions. Lymph: No cervical or inguinal adenopathy. Neurologic: Grossly intact, no focal deficits, moving all 4 extremities. Psychiatric: Normal mood and affect.  Laboratory Data: Lab Results  Component Value Date   WBC 7.7 07/04/2023   HGB 15.1 07/04/2023   HCT 45.4 07/04/2023   MCV 96 07/04/2023   PLT 274 07/04/2023    Lab Results  Component Value Date   CREATININE 1.04 (H) 07/04/2023    No results found for: "PSA"  No results found for: "TESTOSTERONE"  Lab Results  Component Value Date   HGBA1C 5.5 07/04/2023    Urinalysis    Component Value Date/Time   COLORURINE YELLOW 03/22/2010 1106   APPEARANCEUR Clear 01/23/2024 0836   LABSPEC >1.030 (H) 03/22/2010 1106   PHURINE 5.5 03/22/2010 1106   GLUCOSEU Negative 01/23/2024 0836   HGBUR LARGE (A) 03/22/2010 1106   BILIRUBINUR Negative 01/23/2024 0836   KETONESUR NEGATIVE 03/22/2010 1106   PROTEINUR Negative 01/23/2024 0836   PROTEINUR 30 (A) 03/22/2010 1106   UROBILINOGEN 0.2 12/11/2023 0855   UROBILINOGEN 0.2 03/22/2010 1106   NITRITE Negative 01/23/2024 0836   NITRITE NEGATIVE 03/22/2010 1106   LEUKOCYTESUR Negative 01/23/2024 0836    Pertinent Imaging: Urine reviewed.  Urine sent for culture.  Chart reviewed  Assessment & Plan: Patient has mixed incontinence and significant urinary frequency.  Sometimes she has bedwetting.  She has failed 2 medications.  Predominant symptom is stress incontinence.  Role of urodynamics and cystoscopy discussed.  Patient is from [place].  She has seen at least 1 provider there and was supposed to see Dr. Claretta Croft.  Site of service following urodynamics discussed.   Patient agreed that she will follow-up in [place] as this will be the most efficient care and I will save her a much longer drive time.  This would also be important if she ended up having a sling as well as it would be done by my partner in [place]  The patient told our nursing staff that [facility] does not accept her insurance.  Recognizing that she may need a sling or bulking agent which should be done in [place] she will be referred to urogynecology at [facility].  Her diagnostics and therapy can be done there.  1. Mixed incontinence (Primary)  - Urinalysis, Complete   No  follow-ups on file.  Devorah Fonder, MD  [facility] [address] [place], [place] [address] [phone]

## 2024-02-05 ENCOUNTER — Ambulatory Visit (HOSPITAL_COMMUNITY)
Admission: RE | Admit: 2024-02-05 | Discharge: 2024-02-05 | Disposition: A | Discharge status: Home / Self Care | Source: Ambulatory Visit | Attending: Internal Medicine | Admitting: Internal Medicine

## 2024-02-05 ENCOUNTER — Encounter (HOSPITAL_BASED_OUTPATIENT_CLINIC_OR_DEPARTMENT_OTHER)
Admission: RE | Admit: 2024-02-05 | Discharge: 2024-02-05 | Disposition: A | Discharge status: Home / Self Care | Source: Ambulatory Visit | Attending: Internal Medicine | Admitting: Internal Medicine

## 2024-02-05 DIAGNOSIS — R0609 Other forms of dyspnea: Secondary | ICD-10-CM

## 2024-02-05 LAB — NM MYOCAR MULTI W/SPECT W/WALL MOTION / EF
Angina Index: 0
Duke Treadmill Score: 5
Estimated workload: 7
Exercise duration (min): 4 min
Exercise duration (sec): 49 s
LV dias vol: 54 mL (ref 46–106)
LV sys vol: 11 mL
MPHR: 168 {beats}/min
Nuc Stress EF: 80 %
Peak HR: 153 {beats}/min
Percent HR: 91 %
RATE: 0.3
RPE: 19
Rest HR: 72 {beats}/min
Rest Nuclear Isotope Dose: 10.8 mCi
SDS: 2
SRS: 6
SSS: 8
ST Depression (mm): 0 mm
Stress Nuclear Isotope Dose: 32.2 mCi
TID: 0.84

## 2024-02-05 MED ORDER — SODIUM CHLORIDE FLUSH 0.9 % IV SOLN
INTRAVENOUS | Status: AC
  Administered 2024-02-05: 10 mL via INTRAVENOUS
  Filled 2024-02-05: qty 10

## 2024-02-05 MED ORDER — TECHNETIUM TC 99M TETROFOSMIN IV KIT
10.8000 | PACK | Freq: Once | INTRAVENOUS | Status: AC | PRN
  Administered 2024-02-05: 10.8 via INTRAVENOUS

## 2024-02-05 MED ORDER — TECHNETIUM TC 99M TETROFOSMIN IV KIT
32.2000 | PACK | Freq: Once | INTRAVENOUS | Status: AC | PRN
  Administered 2024-02-05: 32.2 via INTRAVENOUS

## 2024-02-05 MED ORDER — REGADENOSON 0.4 MG/5ML IV SOLN
INTRAVENOUS | Status: AC
  Filled 2024-02-05: qty 5

## 2024-02-06 ENCOUNTER — Ambulatory Visit: Payer: Self-pay | Admitting: Internal Medicine

## 2024-02-07 LAB — CULTURE, URINE COMPREHENSIVE

## 2024-02-09 ENCOUNTER — Other Ambulatory Visit: Payer: Self-pay | Admitting: Family Medicine

## 2024-02-11 ENCOUNTER — Ambulatory Visit: Payer: Self-pay

## 2024-02-11 ENCOUNTER — Encounter: Payer: Self-pay | Admitting: Urology

## 2024-02-11 DIAGNOSIS — B3731 Acute candidiasis of vulva and vagina: Secondary | ICD-10-CM

## 2024-02-11 MED ORDER — NITROFURANTOIN MACROCRYSTAL 100 MG PO CAPS: 100.0000 mg | ORAL_CAPSULE | Freq: Two times a day (BID) | ORAL | 0 refills | Status: AC

## 2024-02-12 MED ORDER — FLUCONAZOLE 150 MG PO TABS: 150.0000 mg | ORAL_TABLET | Freq: Once | ORAL | 0 refills | Status: AC

## 2024-02-13 ENCOUNTER — Ambulatory Visit (HOSPITAL_COMMUNITY): Admission: RE | Admit: 2024-02-13 | Source: Ambulatory Visit

## 2024-02-20 ENCOUNTER — Ambulatory Visit (HOSPITAL_COMMUNITY)
Admission: RE | Admit: 2024-02-20 | Discharge: 2024-02-20 | Disposition: A | Discharge status: Home / Self Care | Source: Ambulatory Visit | Attending: Family Medicine | Admitting: Family Medicine

## 2024-02-20 ENCOUNTER — Ambulatory Visit: Payer: Medicaid Other | Admitting: Family Medicine

## 2024-02-20 VITALS — BP 95/55 | HR 101 | Temp 99.5°F | Resp 16 | Ht 66.0 in | Wt 148.1 lb

## 2024-02-20 DIAGNOSIS — R0602 Shortness of breath: Secondary | ICD-10-CM

## 2024-02-20 DIAGNOSIS — E559 Vitamin D deficiency, unspecified: Secondary | ICD-10-CM | POA: Diagnosis not present

## 2024-02-20 DIAGNOSIS — J988 Other specified respiratory disorders: Secondary | ICD-10-CM

## 2024-02-20 DIAGNOSIS — R7301 Impaired fasting glucose: Secondary | ICD-10-CM | POA: Diagnosis not present

## 2024-02-20 DIAGNOSIS — E782 Mixed hyperlipidemia: Secondary | ICD-10-CM | POA: Diagnosis not present

## 2024-02-20 DIAGNOSIS — E038 Other specified hypothyroidism: Secondary | ICD-10-CM

## 2024-02-20 DIAGNOSIS — J069 Acute upper respiratory infection, unspecified: Secondary | ICD-10-CM

## 2024-02-20 MED ORDER — ALPRAZOLAM 0.5 MG PO TABS: 0.5000 mg | ORAL_TABLET | Freq: Every day | ORAL | 0 refills | Status: DC

## 2024-02-20 MED ORDER — AZITHROMYCIN 250 MG PO TABS: ORAL_TABLET | ORAL | 0 refills | Status: DC

## 2024-02-20 MED ORDER — PROMETHAZINE-DM 6.25-15 MG/5ML PO SYRP: 5.0000 mL | ORAL_SOLUTION | Freq: Four times a day (QID) | ORAL | 0 refills | Status: DC | PRN

## 2024-02-20 MED ORDER — NYSTATIN 100000 UNIT/ML MT SUSP: 5.0000 mL | Freq: Four times a day (QID) | OROMUCOSAL | 0 refills | Status: DC

## 2024-02-20 NOTE — Patient Instructions (Signed)

## 2024-02-20 NOTE — Assessment & Plan Note (Signed)
 Chest Xray ordered Azithromycin 250 mg twice daily x 5 days, promethazine  syrup PRN,  Advise patient to rest to support your body's recovery. Stay hydrated by drinking water, tea, or broth. Using a humidifier can help soothe throat irritation and ease nasal congestion. For fever or pain, acetaminophen  (Tylenol ) is recommended. To relieve other symptoms, try saline nasal sprays, throat lozenges, or gargling with saltwater. Focus on eating light, healthy meals like fruits and vegetables to keep your strength up. Practice good hygiene by washing your hands frequently and covering your mouth when coughing or sneezing.Follow-up for worsening or persistent symptoms. Patient verbalizes understanding regarding plan of care and all questions answered

## 2024-02-20 NOTE — Progress Notes (Signed)
 Established Patient Office Visit   Subjective  Patient ID: Lacey Woodard, female    DOB: 1971-04-21  Age: 53 y.o. MRN: [id number]  Chief Complaint  Patient presents with   Headache    Started Sun with bad headache and chills. Progressed to coughing and sinus drainage. Producing some yellowish white mucus. Reports some SOB and oxygen is low today. Took a covid test Sunday am when symptoms started and it was negative.     She  has a past medical history of Acid reflux, Anxiety, Depression, Emphysema lung (HCC), and Hyperlipidemia.  Chief Complaint: Evaluation of cough and fever  History of Present Illness: The patient presents with complaints of progressively worsening respiratory symptoms over the past few days. She reports shortness of breath at rest and with exertion, fatigue, fever, headache, nausea, general malaise, sore throat, and productive cough with sputum. She denies chest pain and vomiting. She has been taking over-the-counter analgesics and antipyretics, which have provided partial relief of symptoms. There is a history of prior pneumonia. A recent COVID-19 test was negative.    Review of Systems  Constitutional:  Positive for chills, fever and malaise/fatigue.  HENT:  Positive for congestion.   Respiratory:  Positive for cough, sputum production and shortness of breath.   Cardiovascular:  Negative for chest pain.  Neurological:  Positive for headaches. Negative for dizziness.      Objective:     BP (!) 95/55   Pulse (!) 101   Temp 99.5 F (37.5 C) (Oral)   Resp 16   Ht 5' 6 (1.676 m)   Wt 148 lb 1.9 oz (67.2 kg)   SpO2 (!) 85%   BMI 23.91 kg/m  BP Readings from Last 3 Encounters:  02/20/24 (!) 95/55  02/04/24 125/80  01/23/24 105/69      Physical Exam Vitals reviewed.  Constitutional:      General: She is not in acute distress.    Appearance: Normal appearance. She is not ill-appearing, toxic-appearing or diaphoretic.  HENT:     Head:  Normocephalic.     Mouth/Throat:     Pharynx: Posterior oropharyngeal erythema present.   Eyes:     General:        Right eye: No discharge.        Left eye: No discharge.     Conjunctiva/sclera: Conjunctivae normal.    Cardiovascular:     Rate and Rhythm: Normal rate.     Pulses: Normal pulses.     Heart sounds: Normal heart sounds.  Pulmonary:     Effort: Pulmonary effort is normal. No respiratory distress.     Breath sounds: Rhonchi present.  Abdominal:     General: Bowel sounds are normal.     Tenderness: There is no right CVA tenderness.  Lymphadenopathy:     Cervical: Cervical adenopathy present.   Skin:    General: Skin is warm and dry.   Neurological:     Mental Status: She is alert.   Psychiatric:        Mood and Affect: Mood normal.        Behavior: Behavior normal.      No results found for any visits on 02/20/24.  The 10-year ASCVD risk score (Arnett DK, et al., 2019) is: 1.5%    Assessment & Plan:  Mixed hyperlipidemia -     Lipid panel -     CMP14+EGFR -     CBC with Differential/Platelet  TSH (thyroid-stimulating hormone deficiency) -  TSH + free T4  Vitamin D  deficiency -     VITAMIN D  25 Hydroxy (Vit-D Deficiency, Fractures)  IFG (impaired fasting glucose) -     Hemoglobin A1c  SOB (shortness of breath) -     DG Chest 2 View; Future  Respiratory infection -     Azithromycin; Take 2 tablets on day 1, then 1 tablet daily on days 2 through 5  Dispense: 6 tablet; Refill: 0  Upper respiratory tract infection, unspecified type Assessment & Plan: Chest Xray ordered Azithromycin 250 mg twice daily x 5 days, promethazine  syrup PRN,  Advise patient to rest to support your body's recovery. Stay hydrated by drinking water, tea, or broth. Using a humidifier can help soothe throat irritation and ease nasal congestion. For fever or pain, acetaminophen  (Tylenol ) is recommended. To relieve other symptoms, try saline nasal sprays, throat lozenges,  or gargling with saltwater. Focus on eating light, healthy meals like fruits and vegetables to keep your strength up. Practice good hygiene by washing your hands frequently and covering your mouth when coughing or sneezing.Follow-up for worsening or persistent symptoms. Patient verbalizes understanding regarding plan of care and all questions answered       Other orders -     Nystatin; Take 5 mLs (500,000 Units total) by mouth 4 (four) times daily.  Dispense: 60 mL; Refill: 0 -     Promethazine -DM; Take 5 mLs by mouth 4 (four) times daily as needed for cough.  Dispense: 118 mL; Refill: 0 -     ALPRAZolam ; Take 1 tablet (0.5 mg total) by mouth at bedtime.  Dispense: 30 tablet; Refill: 0    Return in about 6 months (around 08/21/2024), or if symptoms worsen or fail to improve, for chronic follow-up.   Hilario Kidd Wilhelmena Falter, FNP

## 2024-03-06 ENCOUNTER — Ambulatory Visit (HOSPITAL_COMMUNITY): Admission: RE | Admit: 2024-03-06 | Source: Ambulatory Visit

## 2024-03-21 ENCOUNTER — Ambulatory Visit (HOSPITAL_COMMUNITY)
Admission: RE | Admit: 2024-03-21 | Discharge: 2024-03-21 | Disposition: A | Discharge status: Home / Self Care | Source: Ambulatory Visit | Attending: Internal Medicine | Admitting: Internal Medicine

## 2024-03-21 DIAGNOSIS — R0609 Other forms of dyspnea: Secondary | ICD-10-CM | POA: Diagnosis present

## 2024-03-21 LAB — ECHOCARDIOGRAM COMPLETE
AR max vel: 2.62 cm2
AV Area VTI: 2.68 cm2
AV Area mean vel: 2.46 cm2
AV Mean grad: 4 mmHg
AV Peak grad: 6.4 mmHg
Ao pk vel: 1.26 m/s
Area-P 1/2: 4.15 cm2
Calc EF: 63.6 %
MV VTI: 2.67 cm2
S' Lateral: 2.3 cm
Single Plane A2C EF: 69.7 %
Single Plane A4C EF: 60.1 %

## 2024-03-23 ENCOUNTER — Encounter: Payer: Self-pay | Admitting: Urology

## 2024-03-24 ENCOUNTER — Encounter: Payer: Self-pay | Admitting: Internal Medicine

## 2024-03-26 ENCOUNTER — Other Ambulatory Visit: Payer: Self-pay | Admitting: Family Medicine

## 2024-04-15 ENCOUNTER — Ambulatory Visit: Admitting: Pulmonary Disease

## 2024-04-15 ENCOUNTER — Encounter

## 2024-04-15 ENCOUNTER — Encounter: Payer: Self-pay | Admitting: Family Medicine

## 2024-04-16 ENCOUNTER — Other Ambulatory Visit: Payer: Self-pay | Admitting: Family Medicine

## 2024-04-16 MED ORDER — ALPRAZOLAM 0.5 MG PO TABS: 0.5000 mg | ORAL_TABLET | Freq: Every day | ORAL | 0 refills | Status: DC

## 2024-04-16 NOTE — Telephone Encounter (Signed)
 sent

## 2024-04-29 ENCOUNTER — Ambulatory Visit
Admission: RE | Admit: 2024-04-29 | Discharge: 2024-04-29 | Disposition: A | Discharge status: Home / Self Care | Attending: Family Medicine | Admitting: Family Medicine

## 2024-04-29 ENCOUNTER — Other Ambulatory Visit: Payer: Self-pay

## 2024-04-29 VITALS — BP 125/76 | HR 98 | Temp 99.0°F | Resp 20

## 2024-04-29 DIAGNOSIS — B029 Zoster without complications: Secondary | ICD-10-CM

## 2024-04-29 MED ORDER — VALACYCLOVIR HCL 1 G PO TABS: 1000.0000 mg | ORAL_TABLET | Freq: Three times a day (TID) | ORAL | 0 refills | Status: DC

## 2024-04-29 MED ORDER — LIDOCAINE VISCOUS HCL 2 % MT SOLN: 10.0000 mL | OROMUCOSAL | 0 refills | Status: DC | PRN

## 2024-04-29 MED ORDER — DEXAMETHASONE SODIUM PHOSPHATE 10 MG/ML IJ SOLN
10.0000 mg | Freq: Once | INTRAMUSCULAR | Status: AC
  Administered 2024-04-29: 10 mg via INTRAMUSCULAR

## 2024-04-29 NOTE — ED Triage Notes (Signed)
 Pt reports blisters in mouth and right side of mouth since Sunday. Denies any known changes in self-care products. Airway patent. NAD noted. Denies any change with otc medications/treatments.

## 2024-04-29 NOTE — ED Provider Notes (Signed)
 [facility]    CSN: [id number] Arrival date & time: 04/29/24  1446      History   Chief Complaint Chief Complaint  Patient presents with   Blister    Blisters and swelling in mouth - Entered by patient    HPI Lacey Woodard is a 53 y.o. female.   Patient presenting today with painful blisters to the right side of face extending to corner of mouth and the right inner cheek for the past 2 days.  Denies any new soaps or products, new medications or foods, throat itching or swelling, chest tightness, fevers, chills.  History of shingles and states this feels similar.  So far over-the-counter pain relievers did not seem to be helping.    Past Medical History:  Diagnosis Date   Acid reflux    Anxiety    Depression    Emphysema lung (HCC)    Hyperlipidemia     Patient Active Problem List   Diagnosis Date Noted   Upper respiratory infection 02/20/2024   DOE (dyspnea on exertion) 01/09/2024   Nicotine abuse 01/09/2024   Palpitations 01/09/2024   Emphysema lung (HCC) 08/20/2023   Hyperlipidemia LDL goal <70 08/20/2023   Anxiety and depression 06/25/2023   Mixed stress and urge urinary incontinence 12/01/2022   Current use of estrogen therapy 10/11/2022   Hair thinning 09/12/2022   Moody 09/12/2022   S/P hysterectomy with oophorectomy 09/12/2022   Dyspareunia in female 09/12/2022   Vaginal dryness 09/12/2022   Night sweats 09/12/2022    Past Surgical History:  Procedure Laterality Date   ABDOMINAL HYSTERECTOMY     bilateral inguinal hernia repair     COLONOSCOPY WITH PROPOFOL  N/A 08/08/2022   Procedure: COLONOSCOPY WITH PROPOFOL ;  Surgeon: Cindie Carlin POUR, DO;  [facility];  Service: Endoscopy;  Laterality: N/A;  1:00pm, asa 2    OB History     Gravida  2   Para  2   Term  2   Preterm      AB      Living  2      SAB      IAB      Ectopic      Multiple      Live Births               Home Medications    Prior  to Admission medications   Medication Sig Start Date End Date Taking? Authorizing Provider  lidocaine  (XYLOCAINE ) 2 % solution Use as directed 10 mLs in the mouth or throat every 3 (three) hours as needed. 04/29/24  Yes Stuart Vernell Norris, PA-C  valACYclovir  (VALTREX ) 1000 MG tablet Take 1 tablet (1,000 mg total) by mouth 3 (three) times daily. 04/29/24  Yes Stuart Vernell Norris, PA-C  albuterol  (VENTOLIN  HFA) 108 [phone] Base) MCG/ACT inhaler Inhale 2 puffs into the lungs every 6 (six) hours as needed for wheezing or shortness of breath. 08/20/23   Del Orbe Polanco, Iliana, FNP  ALPRAZolam  (XANAX ) 0.5 MG tablet Take 1 tablet (0.5 mg total) by mouth at bedtime. 04/16/24   Del Orbe Polanco, Iliana, FNP  azithromycin  (ZITHROMAX ) 250 MG tablet Take 2 tablets on day 1, then 1 tablet daily on days 2 through 5 02/20/24   Del Orbe Polanco, Vandiver, FNP  Cholecalciferol (VITAMIN D3) 25 MCG (1000 UT) CAPS Take 1,000 Units by mouth daily.    [provider]  cyclobenzaprine (FLEXERIL) 5 MG tablet SMARTSIG:1 Tablet(s) By Mouth 1-3 Times Daily  02/14/23   [provider]  escitalopram  (LEXAPRO ) 20 MG tablet Take 1 tablet (20 mg total) by mouth daily. 01/01/24   Del Wilhelmena Lloyd Sola, FNP  estradiol  (ESTRACE ) 2 MG tablet Take 1 tablet (2 mg total) by mouth daily. 09/04/23   Signa Delon LABOR, NP  fluticasone (FLONASE) 50 MCG/ACT nasal spray Place 2 sprays into both nostrils daily. 02/14/23   [provider]  gabapentin  (NEURONTIN ) 300 MG capsule TAKE 1 CAPSULE BY MOUTH TWICE DAILY AS NEEDED 03/26/24   Del Orbe Polanco, Sola, FNP  magnesium oxide (MAG-OX) 400 (240 Mg) MG tablet Take 400 mg by mouth at bedtime.    [provider]  nystatin  (MYCOSTATIN ) 100000 UNIT/ML suspension Take 5 mLs (500,000 Units total) by mouth 4 (four) times daily. 02/20/24   Del Wilhelmena Lloyd Sola, FNP  ondansetron  (ZOFRAN -ODT) 4 MG disintegrating tablet Take 4 mg by mouth every 8 (eight) hours as needed.  12/27/23   [provider]  promethazine -dextromethorphan (PROMETHAZINE -DM) 6.25-15 MG/5ML syrup Take 5 mLs by mouth 4 (four) times daily as needed for cough. 02/20/24   Del Orbe Polanco, Iliana, FNP  Tiotropium Bromide-Olodaterol (STIOLTO RESPIMAT ) 2.5-2.5 MCG/ACT AERS Inhale 2 puffs into the lungs daily. 01/02/24   Hunsucker, Donnice SAUNDERS, MD    Family History Family History  Problem Relation Age of Onset   Colon cancer Maternal Aunt     Social History Social History   Tobacco Use   Smoking status: Every Day    Current packs/day: 1.00    Average packs/day: 1 pack/day for 35.7 years (35.7 ttl pk-yrs)    Types: Cigarettes    Start date: 1990   Smokeless tobacco: Never  Vaping Use   Vaping status: Never Used  Substance Use Topics   Alcohol use: No   Drug use: No     Allergies   Patient has no known allergies.   Review of Systems Review of Systems Per HPI  Physical Exam Triage Vital Signs ED Triage Vitals  Encounter Vitals Group     BP 04/29/24 1510 125/76     Girls Systolic BP Percentile --      Girls Diastolic BP Percentile --      Boys Systolic BP Percentile --      Boys Diastolic BP Percentile --      Pulse Rate 04/29/24 1510 98     Resp 04/29/24 1510 20     Temp 04/29/24 1510 99 F (37.2 C)     Temp Source 04/29/24 1510 Oral     SpO2 04/29/24 1510 94 %     Weight --      Height --      Head Circumference --      Peak Flow --      Pain Score 04/29/24 1509 10     Pain Loc --      Pain Education --      Exclude from Growth Chart --    No data found.  Updated Vital Signs BP 125/76 (BP Location: Right Arm)   Pulse 98   Temp 99 F (37.2 C) (Oral)   Resp 20   SpO2 94%   Visual Acuity Right Eye Distance:   Left Eye Distance:   Bilateral Distance:    Right Eye Near:   Left Eye Near:    Bilateral Near:     Physical Exam Vitals and nursing note reviewed.  Constitutional:      Appearance: Normal appearance. She is not ill-appearing.  HENT:  Head: Atraumatic.  Eyes:     Extraocular Movements: Extraocular movements intact.     Conjunctiva/sclera: Conjunctivae normal.  Cardiovascular:     Rate and Rhythm: Normal rate.  Pulmonary:     Effort: Pulmonary effort is normal.  Musculoskeletal:        General: Normal range of motion.     Cervical back: Normal range of motion and neck supple.  Skin:    General: Skin is warm and dry.     Findings: Rash present.     Comments: Erythematous clusters of blisters and scabs extending from the right cheek down to the right buccal mucosa  Neurological:     Mental Status: She is alert and oriented to person, place, and time.  Psychiatric:        Mood and Affect: Mood normal.        Thought Content: Thought content normal.        Judgment: Judgment normal.      UC Treatments / Results  Labs (all labs ordered are listed, but only abnormal results are displayed) Labs Reviewed - No data to display  EKG   Radiology No results found.  Procedures Procedures (including critical care time)  Medications Ordered in UC Medications  dexamethasone  (DECADRON ) injection 10 mg (has no administration in time range)    Initial Impression / Assessment and Plan / UC Course  I have reviewed the triage vital signs and the nursing notes.  Pertinent labs & imaging results that were available during my care of the patient were reviewed by me and considered in my medical decision making (see chart for details).     Consistent with shingles of the right side of face, not extending to the ear or eye region.  Treat with Valtrex , viscous lidocaine , over-the-counter pain relievers as needed.  IM Decadron  given to help with pain and inflammation additionally.  Note given.  Final Clinical Impressions(s) / UC Diagnoses   Final diagnoses:  Herpes zoster without complication   Discharge Instructions   None    ED Prescriptions     Medication Sig Dispense Auth. Provider   valACYclovir   (VALTREX ) 1000 MG tablet Take 1 tablet (1,000 mg total) by mouth 3 (three) times daily. 21 tablet Stuart Vernell Norris, PA-C   lidocaine  (XYLOCAINE ) 2 % solution Use as directed 10 mLs in the mouth or throat every 3 (three) hours as needed. 100 mL Stuart Vernell Norris, [place]      PDMP not reviewed this encounter.   Stuart Vernell Norris, PA-C 04/29/24 [id number]

## 2024-05-01 ENCOUNTER — Telehealth: Admitting: Physician Assistant

## 2024-05-01 ENCOUNTER — Ambulatory Visit: Payer: Self-pay

## 2024-05-01 DIAGNOSIS — B029 Zoster without complications: Secondary | ICD-10-CM

## 2024-05-01 NOTE — Progress Notes (Signed)
 Virtual Visit Consent   Lacey Woodard, you are scheduled for a virtual visit with a [facility] provider today. Just as with appointments in the office, your consent must be obtained to participate. Your consent will be active for this visit and any virtual visit you may have with one of our providers in the next 365 days. If you have a MyChart account, a copy of this consent can be sent to you electronically.  As this is a virtual visit, video technology does not allow for your provider to perform a traditional examination. This may limit your provider's ability to fully assess your condition. If your provider identifies any concerns that need to be evaluated in person or the need to arrange testing (such as labs, EKG, etc.), we will make arrangements to do so. Although advances in technology are sophisticated, we cannot ensure that it will always work on either your end or our end. If the connection with a video visit is poor, the visit may have to be switched to a telephone visit. With either a video or telephone visit, we are not always able to ensure that we have a secure connection.  By engaging in this virtual visit, you consent to the provision of healthcare and authorize for your insurance to be billed (if applicable) for the services provided during this visit. Depending on your insurance coverage, you may receive a charge related to this service.  I need to obtain your verbal consent now. Are you willing to proceed with your visit today? Lacey Woodard has provided verbal consent on 05/01/2024 for a virtual visit (video or telephone). Delon CHRISTELLA Dickinson, PA-C  Date: 05/01/2024 2:43 PM   Virtual Visit via Video Note   I, Delon CHRISTELLA Dickinson, connected with  Lacey Woodard  ([id number], Aug 09, 1971) on 05/01/24 at  2:15 PM EDT by a video-enabled telemedicine application and verified that I am speaking with the correct person using two identifiers.  Location: Patient: Virtual Visit  Location Patient: Home Provider: Virtual Visit Location Provider: Home Office   I discussed the limitations of evaluation and management by telemedicine and the availability of in person appointments. The patient expressed understanding and agreed to proceed.    History of Present Illness: Lacey Woodard is a 53 y.o. who identifies as a female who was assigned female at birth, and is being seen today for shingles.  HPI: Rash This is a new problem. The current episode started in the past 7 days (Started 04/29/24. Given Valacyclovir  1g take TID x 7 days). The problem has been gradually worsening since onset. The affected locations include the face, head and scalp. The rash is characterized by blistering, pain and redness. She was exposed to nothing. Associated symptoms include fatigue. Pertinent negatives include no congestion, cough, eye pain (it is close to the eye, but is having no eye pain or visual change), facial edema, fever or sore throat. (Mouth pain (lesions inside mouth)) Treatments tried: She is on Valacyclovir , Viscous Lidoacine already; Has at home Gabapentin  300mg  she can take BID. The treatment provided no relief.     Problems:  Patient Active Problem List   Diagnosis Date Noted   Upper respiratory infection 02/20/2024   DOE (dyspnea on exertion) 01/09/2024   Nicotine abuse 01/09/2024   Palpitations 01/09/2024   Emphysema lung (HCC) 08/20/2023   Hyperlipidemia LDL goal <70 08/20/2023   Anxiety and depression 06/25/2023   Mixed stress and urge urinary incontinence 12/01/2022   Current use of estrogen  therapy 10/11/2022   Hair thinning 09/12/2022   Moody 09/12/2022   S/P hysterectomy with oophorectomy 09/12/2022   Dyspareunia in female 09/12/2022   Vaginal dryness 09/12/2022   Night sweats 09/12/2022    Allergies: No Known Allergies Medications:  Current Outpatient Medications:    albuterol  (VENTOLIN  HFA) 108 (90 Base) MCG/ACT inhaler, Inhale 2 puffs into the lungs  every 6 (six) hours as needed for wheezing or shortness of breath., Disp: 8 g, Rfl: 0   ALPRAZolam  (XANAX ) 0.5 MG tablet, Take 1 tablet (0.5 mg total) by mouth at bedtime., Disp: 30 tablet, Rfl: 0   azithromycin  (ZITHROMAX ) 250 MG tablet, Take 2 tablets on day 1, then 1 tablet daily on days 2 through 5, Disp: 6 tablet, Rfl: 0   Cholecalciferol (VITAMIN D3) 25 MCG (1000 UT) CAPS, Take 1,000 Units by mouth daily., Disp: , Rfl:    cyclobenzaprine (FLEXERIL) 5 MG tablet, SMARTSIG:1 Tablet(s) By Mouth 1-3 Times Daily, Disp: , Rfl:    escitalopram  (LEXAPRO ) 20 MG tablet, Take 1 tablet (20 mg total) by mouth daily., Disp: 30 tablet, Rfl: 3   estradiol  (ESTRACE ) 2 MG tablet, Take 1 tablet (2 mg total) by mouth daily., Disp: 30 tablet, Rfl: 6   fluticasone (FLONASE) 50 MCG/ACT nasal spray, Place 2 sprays into both nostrils daily., Disp: , Rfl:    gabapentin  (NEURONTIN ) 300 MG capsule, TAKE 1 CAPSULE BY MOUTH TWICE DAILY AS NEEDED, Disp: 90 capsule, Rfl: 0   lidocaine  (XYLOCAINE ) 2 % solution, Use as directed 10 mLs in the mouth or throat every 3 (three) hours as needed., Disp: 100 mL, Rfl: 0   magnesium oxide (MAG-OX) 400 (240 Mg) MG tablet, Take 400 mg by mouth at bedtime., Disp: , Rfl:    nystatin  (MYCOSTATIN ) 100000 UNIT/ML suspension, Take 5 mLs (500,000 Units total) by mouth 4 (four) times daily., Disp: 60 mL, Rfl: 0   ondansetron  (ZOFRAN -ODT) 4 MG disintegrating tablet, Take 4 mg by mouth every 8 (eight) hours as needed., Disp: , Rfl:    promethazine -dextromethorphan (PROMETHAZINE -DM) 6.25-15 MG/5ML syrup, Take 5 mLs by mouth 4 (four) times daily as needed for cough., Disp: 118 mL, Rfl: 0   Tiotropium Bromide-Olodaterol (STIOLTO RESPIMAT ) 2.5-2.5 MCG/ACT AERS, Inhale 2 puffs into the lungs daily., Disp: 1 each, Rfl: 11   valACYclovir  (VALTREX ) 1000 MG tablet, Take 1 tablet (1,000 mg total) by mouth 3 (three) times daily., Disp: 21 tablet, Rfl: 0  Observations/Objective: Patient is well-developed,  well-nourished in no acute distress.  Resting comfortably at home.  Head is normocephalic, atraumatic.  No labored breathing.  Speech is clear and coherent with logical content.  Patient is alert and oriented at baseline.    Assessment and Plan: 1. Herpes zoster without complication (Primary)  - Progressing despite treatment - Noticing new lesions going up beside her right eye, forehead and scalp - Advised for her to be seen in person for consideration of IV treatment since reported to be spreading while on oral treatment, however, patient desires to wait one more day before going - Advised for hr to continue valacyclovir  - Continue her home Gabapentin  but could take 3 times daily (every 8 hours) while with shingles - Continue viscous lidocaine  for mouth pain - Seek immediate in person care at closest ER if any visual changes or eye pain develop and/or if rash continues to spread with new lesions developing  Follow Up Instructions: I discussed the assessment and treatment plan with the patient. The patient was provided an opportunity to  ask questions and all were answered. The patient agreed with the plan and demonstrated an understanding of the instructions.  A copy of instructions were sent to the patient via MyChart unless otherwise noted below.    The patient was advised to call back or seek an in-person evaluation if the symptoms worsen or if the condition fails to improve as anticipated.    Delon CHRISTELLA Dickinson, PA-C

## 2024-05-01 NOTE — Telephone Encounter (Signed)
 FYI Only or Action Required?: FYI only for provider.  Patient was last seen in primary care on 02/20/2024 by Terry Wilhelmena Lloyd Hilario, FNP.  Called Nurse Triage reporting Herpes Zoster.  Symptoms began yesterday.  Interventions attempted: OTC medications: tylenol  and Prescription medications: valtrex .  Symptoms are: gradually worsening.  Triage Disposition: See Physician Within 24 Hours  Patient/caregiver understands and will follow disposition?: Yes   Copied from CRM #[id number]. Topic: Clinical - Red Word Triage >> May 01, 2024 11:35 AM Tobias CROME wrote: Red Word that prompted transfer to Nurse Triage: Patient seen at [facility] for shingles, patient states it is worsening, it is all over her face going up into her hairline.   In severe pain due to the shingles Reason for Disposition  SEVERE pain (e.g., excruciating)    Pain from shingles  Answer Assessment - Initial Assessment Questions 1. APPEARANCE of RASH: What does the rash look like?      Blisters, fluid 2. LOCATION: Where is the rash located?      All over right side of face and right side of mouth 3. ONSET: When did the rash start?      yesterday 4. ITCHING: Does the rash itch? If Yes, ask: How bad is the itch?  (Scale 1-10; or mild, moderate, severe) 8 or 9/10 taking tylenol  without relief 5. PAIN: Does the rash hurt? If Yes, ask: How bad is the pain?  (Scale 0-10; or none, mild, moderate, severe)     moderate 6. OTHER SYMPTOMS: Do you have any other symptoms? (e.g., fever)     Shingles is Worsening going into hairiline.   7. PREGNANCY: Is there any chance you are pregnant? When was your last menstrual period?    Na   Pt dx with shingles at urgent care: see chart.  Protocols used: Shingles (Zoster)-A-AH

## 2024-05-01 NOTE — Telephone Encounter (Signed)
 Scheduled

## 2024-05-01 NOTE — Patient Instructions (Signed)
 Lacey Woodard, thank you for joining Delon CHRISTELLA Dickinson, PA-C for today's virtual visit.  While this provider is not your primary care provider (PCP), if your PCP is located in our provider database this encounter information will be shared with them immediately following your visit.   A [facility] MyChart account gives you access to today's visit and all your visits, tests, and labs performed at [facility]  click here if you don't have a [facility] MyChart account or go to [email or web address]  Consent: (Patient) Lacey Woodard provided verbal consent for this virtual visit at the beginning of the encounter.  Current Medications:  Current Outpatient Medications:    albuterol  (VENTOLIN  HFA) 108 (90 Base) MCG/ACT inhaler, Inhale 2 puffs into the lungs every 6 (six) hours as needed for wheezing or shortness of breath., Disp: 8 g, Rfl: 0   ALPRAZolam  (XANAX ) 0.5 MG tablet, Take 1 tablet (0.5 mg total) by mouth at bedtime., Disp: 30 tablet, Rfl: 0   azithromycin  (ZITHROMAX ) 250 MG tablet, Take 2 tablets on day 1, then 1 tablet daily on days 2 through 5, Disp: 6 tablet, Rfl: 0   Cholecalciferol (VITAMIN D3) 25 MCG (1000 UT) CAPS, Take 1,000 Units by mouth daily., Disp: , Rfl:    cyclobenzaprine (FLEXERIL) 5 MG tablet, SMARTSIG:1 Tablet(s) By Mouth 1-3 Times Daily, Disp: , Rfl:    escitalopram  (LEXAPRO ) 20 MG tablet, Take 1 tablet (20 mg total) by mouth daily., Disp: 30 tablet, Rfl: 3   estradiol  (ESTRACE ) 2 MG tablet, Take 1 tablet (2 mg total) by mouth daily., Disp: 30 tablet, Rfl: 6   fluticasone (FLONASE) 50 MCG/ACT nasal spray, Place 2 sprays into both nostrils daily., Disp: , Rfl:    gabapentin  (NEURONTIN ) 300 MG capsule, TAKE 1 CAPSULE BY MOUTH TWICE DAILY AS NEEDED, Disp: 90 capsule, Rfl: 0   lidocaine  (XYLOCAINE ) 2 % solution, Use as directed 10 mLs in the mouth or throat every 3 (three) hours as needed., Disp: 100 mL, Rfl: 0   magnesium oxide (MAG-OX) 400 (240  Mg) MG tablet, Take 400 mg by mouth at bedtime., Disp: , Rfl:    nystatin  (MYCOSTATIN ) 100000 UNIT/ML suspension, Take 5 mLs (500,000 Units total) by mouth 4 (four) times daily., Disp: 60 mL, Rfl: 0   ondansetron  (ZOFRAN -ODT) 4 MG disintegrating tablet, Take 4 mg by mouth every 8 (eight) hours as needed., Disp: , Rfl:    promethazine -dextromethorphan (PROMETHAZINE -DM) 6.25-15 MG/5ML syrup, Take 5 mLs by mouth 4 (four) times daily as needed for cough., Disp: 118 mL, Rfl: 0   Tiotropium Bromide-Olodaterol (STIOLTO RESPIMAT ) 2.5-2.5 MCG/ACT AERS, Inhale 2 puffs into the lungs daily., Disp: 1 each, Rfl: 11   valACYclovir  (VALTREX ) 1000 MG tablet, Take 1 tablet (1,000 mg total) by mouth 3 (three) times daily., Disp: 21 tablet, Rfl: 0   Medications ordered in this encounter:  No orders of the defined types were placed in this encounter.    *If you need refills on other medications prior to your next appointment, please contact your pharmacy*  Follow-Up: Call back or seek an in-person evaluation if the symptoms worsen or if the condition fails to improve as anticipated.  [facility] [phone]  Other Instructions  Shingles  Shingles, or herpes zoster, is an infection. It gives you a skin rash and blisters. These infected areas may hurt a lot. Shingles only happens if: You've had chickenpox. You've been given a shot called a vaccine to protect you from getting chickenpox. Shingles  is rare in this case. What are the causes? Shingles is caused by a germ called the varicella-zoster virus. This is the same germ that causes chickenpox. After you're exposed to the germ, it stays in your body but is dormant. This means it isn't active. Shingles happens if the germ becomes active again. This can happen years after you're first exposed to the germ. What increases the risk? You may be more likely to get shingles if: You're older than 53 years of age. You're under a lot of stress. You  have a weak immune system. The immune system is your body's defense system. It may be weak if: You have human immunodeficiency virus (HIV). You have acquired immunodeficiency syndrome (AIDS). You have cancer. You take medicines that weaken your immune system. These include organ transplant medicines. What are the signs or symptoms? The first symptoms of shingles may be itching, tingling, or pain. Your skin may feel like it's burning. A few days or weeks later, you'll get a rash. Here's what you can expect: The rash is likely to be on one side of your body. The rash may be shaped like a belt or a band. Over time, it will turn into blisters filled with fluid. The blisters will break open and change into scabs. The scabs will dry up in about 2-3 weeks. You may also have: A fever. Chills. A headache. Nausea. How is this diagnosed? Shingles is diagnosed with a skin exam. A sample called a culture may be taken from one of your blisters and sent to a lab. This will show if you have shingles. How is this treated? The rash may last for several weeks. There's no cure for shingles, but your health care provider may give you medicines. These medicines may: Help with pain. Help with itching. Help with irritation and swelling. Help you get better sooner. Help to prevent long-term problems. If the rash is on your face, you may need to see an eye doctor or an ear, nose, and throat (ENT) doctor. Follow these instructions at home: Medicines Take your medicines only as told by your provider. Put an anti-itch cream or numbing cream on the rash or blisters as told by your provider. Relieving itching and discomfort  To help with itching: Put cold, wet cloths called cold compresses on the rash or blisters. Take a cool bath. Try adding baking soda or dry oatmeal to the water. Do not bathe in hot water. Use calamine lotion on the rash or blisters. You can get this type of lotion at the store. Blister and  rash care Keep your rash covered with a loose bandage. Wear loose clothes that don't rub on your rash. Take care of your rash as told by your provider. Make sure you: Wash your hands with soap and water for at least 20 seconds before and after you change your bandage. If you can't use soap and water, use hand sanitizer. Keep your rash and blisters clean by washing them with mild soap and cool water. Change your bandage. Check your rash every day for signs of infection. Check for: More redness, swelling, or pain. Fluid or blood. Warmth. Pus or a bad smell. Do not scratch your rash. Do not pick at your blisters. To help you not scratch: Keep your fingernails clean and cut short. Try to wear gloves or mittens when you sleep. General instructions Rest. Wash your hands often with soap and water for at least 20 seconds. If you can't use soap and water,  use hand sanitizer. Washing your hands lowers your chance of getting a skin infection. Your infection can cause chickenpox in others. If you have blisters that aren't scabs yet, stay away from: Babies. Pregnant people. Children who have eczema. Older people who have organ transplants. People who have a long-term, or chronic, illness. Anyone who hasn't had chickenpox before. Anyone who hasn't gotten the chickenpox vaccine. How is this prevented? Vaccines are the best way to prevent you from getting chickenpox or shingles. Talk with your provider about getting these shots. Where to find more information Centers for Disease Control and Prevention (CDC): TonerPromos.no Contact a health care provider if: Your pain doesn't get better with medicine. Your pain doesn't get better after the rash heals. You have any signs of infection around the rash. Your rash or blisters get worse. You have a fever or chills. Get help right away if: The rash is on your face or nose. You have pain in your face or by your eye. You lose feeling on one side of your  face. You have trouble seeing. You have ear pain or ringing in your ear. This information is not intended to replace advice given to you by your health care provider. Make sure you discuss any questions you have with your health care provider. Document Revised: 05/17/2023 Document Reviewed: 09/29/2022 Elsevier Patient Education  2024 Elsevier Inc.   If you have been instructed to have an in-person evaluation today at a local Urgent Care facility, please use the link below. It will take you to a list of all of our available [facility], including address, phone number and hours of operation. Please do not delay care.  [facility]  If you or a family member do not have a primary care provider, use the link below to schedule a visit and establish care. When you choose a [facility] primary care physician or advanced practice provider, you gain a long-term partner in health. Find a Primary Care Provider  Learn more about [facility]'s in-office and virtual care options: [facility] - Get Care Now

## 2024-05-01 NOTE — Telephone Encounter (Signed)
 The patient needs an office visit for a proper assessment of their current condition. They need to complete Valtrex  1000 mg as prescribed in urgent care. Gabapentin  has been advised for management of nerve pain.

## 2024-05-02 ENCOUNTER — Ambulatory Visit: Admitting: Nurse Practitioner

## 2024-05-02 ENCOUNTER — Encounter: Payer: Self-pay | Admitting: Nurse Practitioner

## 2024-05-02 VITALS — BP 118/75 | HR 90 | Ht 66.0 in | Wt 152.0 lb

## 2024-05-02 DIAGNOSIS — B029 Zoster without complications: Secondary | ICD-10-CM

## 2024-05-02 DIAGNOSIS — L538 Other specified erythematous conditions: Secondary | ICD-10-CM | POA: Diagnosis not present

## 2024-05-02 MED ORDER — NEOMYCIN-POLYMYXIN-HC 3.5-10000-1 OT SOLN: 4.0000 [drp] | Freq: Four times a day (QID) | OTIC | 0 refills | Status: DC

## 2024-05-02 MED ORDER — ACYCLOVIR 5 % EX OINT: 1.0000 | TOPICAL_OINTMENT | CUTANEOUS | 1 refills | Status: DC

## 2024-05-02 NOTE — Patient Instructions (Signed)
 1) Herpes zoster - Finish valtex and increase gabapentin  300 mg from BID to four times daily and acyclovir  ointment as directed, no heat, no peroxide to oral lesions, salt water and otic susp to right ear 2) Follow up as needed

## 2024-05-02 NOTE — Progress Notes (Signed)
 Established Patient Office Visit  Subjective:  Patient ID: Lacey Woodard, female    DOB: March 26, 1971  Age: 53 y.o. MRN: [id number]  Chief Complaint  Patient presents with   Herpes Zoster    Shingles in mouth, along face    Patient here today for a shingles outbreak.  Right side of face and inner mucosa on right side.  Tingling/burning hell per patient.  Patient went to urgent care and was treated with valacyclovir  and steroid shot.  Patient already on gabapentin  300 mg BID and patient will agree to take 300 mg QID.      No other concerns at this time.   Past Medical History:  Diagnosis Date   Acid reflux    Anxiety    Depression    Emphysema lung (HCC)    Hyperlipidemia     Past Surgical History:  Procedure Laterality Date   ABDOMINAL HYSTERECTOMY     bilateral inguinal hernia repair     COLONOSCOPY WITH PROPOFOL  N/A 08/08/2022   Procedure: COLONOSCOPY WITH PROPOFOL ;  Surgeon: Cindie Carlin POUR, DO;  Location: [facility];  Service: Endoscopy;  Laterality: N/A;  1:00pm, asa 2    Social History   Socioeconomic History   Marital status: Divorced    Spouse name: Not on file   Number of children: Not on file   Years of education: Not on file   Highest education level: 12th grade  Occupational History   Not on file  Tobacco Use   Smoking status: Every Day    Current packs/day: 1.00    Average packs/day: 1 pack/day for 35.7 years (35.7 ttl pk-yrs)    Types: Cigarettes    Start date: 65   Smokeless tobacco: Never  Vaping Use   Vaping status: Never Used  Substance and Sexual Activity   Alcohol use: No   Drug use: No   Sexual activity: Yes    Birth control/protection: Surgical, Post-menopausal    Comment: hyst  Other Topics Concern   Not on file  Social History Narrative   Not on file   Social Drivers of Health   Financial Resource Strain: Medium Risk (02/20/2024)   Overall Financial Resource Strain (CARDIA)    Difficulty of Paying Living Expenses:  Somewhat hard  Food Insecurity: No Food Insecurity (02/20/2024)   Hunger Vital Sign    Worried About Running Out of Food in the Last Year: Never true    Ran Out of Food in the Last Year: Never true  Transportation Needs: No Transportation Needs (02/20/2024)   PRAPARE - Administrator, Civil Service (Medical): No    Lack of Transportation (Non-Medical): No  Physical Activity: Inactive (02/20/2024)   Exercise Vital Sign    Days of Exercise per Week: 2 days    Minutes of Exercise per Session: 0 min  Stress: Stress Concern Present (02/20/2024)   [employer] of Occupational Health - Occupational Stress Questionnaire    Feeling of Stress: Rather much  Social Connections: Socially Isolated (02/20/2024)   Social Connection and Isolation Panel    Frequency of Communication with Friends and Family: Three times a week    Frequency of Social Gatherings with Friends and Family: Never    Attends Religious Services: Never    Database administrator or Organizations: No    Attends Banker Meetings: Not on file    Marital Status: Divorced  Intimate Partner Violence: Not At Risk (09/12/2022)   Humiliation, Afraid, Rape, and Kick  questionnaire    Fear of Current or Ex-Partner: No    Emotionally Abused: No    Physically Abused: No    Sexually Abused: No    Family History  Problem Relation Age of Onset   Colon cancer Maternal Aunt     No Known Allergies  Outpatient Medications Prior to Visit  Medication Sig   albuterol  (VENTOLIN  HFA) 108 (90 Base) MCG/ACT inhaler Inhale 2 puffs into the lungs every 6 (six) hours as needed for wheezing or shortness of breath.   ALPRAZolam  (XANAX ) 0.5 MG tablet Take 1 tablet (0.5 mg total) by mouth at bedtime.   azithromycin  (ZITHROMAX ) 250 MG tablet Take 2 tablets on day 1, then 1 tablet daily on days 2 through 5   Cholecalciferol (VITAMIN D3) 25 MCG (1000 UT) CAPS Take 1,000 Units by mouth daily.   cyclobenzaprine (FLEXERIL) 5 MG tablet  SMARTSIG:1 Tablet(s) By Mouth 1-3 Times Daily   escitalopram  (LEXAPRO ) 20 MG tablet Take 1 tablet (20 mg total) by mouth daily.   estradiol  (ESTRACE ) 2 MG tablet Take 1 tablet (2 mg total) by mouth daily.   fluticasone (FLONASE) 50 MCG/ACT nasal spray Place 2 sprays into both nostrils daily.   gabapentin  (NEURONTIN ) 300 MG capsule TAKE 1 CAPSULE BY MOUTH TWICE DAILY AS NEEDED   lidocaine  (XYLOCAINE ) 2 % solution Use as directed 10 mLs in the mouth or throat every 3 (three) hours as needed.   magnesium oxide (MAG-OX) 400 (240 Mg) MG tablet Take 400 mg by mouth at bedtime.   nystatin  (MYCOSTATIN ) 100000 UNIT/ML suspension Take 5 mLs (500,000 Units total) by mouth 4 (four) times daily.   ondansetron  (ZOFRAN -ODT) 4 MG disintegrating tablet Take 4 mg by mouth every 8 (eight) hours as needed.   promethazine -dextromethorphan (PROMETHAZINE -DM) 6.25-15 MG/5ML syrup Take 5 mLs by mouth 4 (four) times daily as needed for cough.   Tiotropium Bromide-Olodaterol (STIOLTO RESPIMAT ) 2.5-2.5 MCG/ACT AERS Inhale 2 puffs into the lungs daily.   valACYclovir  (VALTREX ) 1000 MG tablet Take 1 tablet (1,000 mg total) by mouth 3 (three) times daily.   No facility-administered medications prior to visit.    ROS     Objective:   BP 118/75   Pulse 90   Ht 5' 6 (1.676 m)   Wt 152 lb (68.9 kg)   SpO2 96%   BMI 24.53 kg/m   Vitals:   05/02/24 1406  BP: 118/75  Pulse: 90  Height: 5' 6 (1.676 m)  Weight: 152 lb (68.9 kg)  SpO2: 96%  BMI (Calculated): 24.55    Physical Exam Vitals and nursing note reviewed.  Constitutional:      Appearance: Normal appearance.  HENT:     Head: Normocephalic.     Right Ear: Tympanic membrane and ear canal normal.     Left Ear: Tympanic membrane and ear canal normal.     Nose: Nose normal.     Mouth/Throat:     Mouth: Mucous membranes are moist.  Cardiovascular:     Rate and Rhythm: Normal rate and regular rhythm.     Pulses: Normal pulses.     Heart sounds:  Normal heart sounds.  Pulmonary:     Effort: Pulmonary effort is normal.     Breath sounds: Normal breath sounds.  Musculoskeletal:        General: Normal range of motion.     Cervical back: Normal range of motion and neck supple.  Skin:    Findings: Lesion present.  Neurological:  Mental Status: She is alert and oriented to person, place, and time.  Psychiatric:        Mood and Affect: Mood normal.        Behavior: Behavior normal.      No results found for any visits on 05/02/24.  Recent Results (from the past 2160 hours)  Urinalysis, Complete     Status: Abnormal   Collection Time: 02/04/24  8:31 AM  Result Value Ref Range   Specific Gravity, UA <1.005 (L) 1.005 - 1.030   pH, UA 6.5 5.0 - 7.5   Color, UA Yellow Yellow   Appearance Ur Clear Clear   Leukocytes,UA Negative Negative   Protein,UA Negative Negative/Trace   Glucose, UA Negative Negative   Ketones, UA Negative Negative   RBC, UA Negative Negative   Bilirubin, UA Negative Negative   Urobilinogen, Ur 0.2 0.2 - 1.0 mg/dL   Nitrite, UA Negative Negative   Microscopic Examination Comment     Comment: Microscopic follows if indicated.   Microscopic Examination See below:     Comment: Microscopic was indicated and was performed.  Microscopic Examination     Status: Abnormal   Collection Time: 02/04/24  8:31 AM   Urine  Result Value Ref Range   WBC, UA 6-10 (A) 0 - 5 /hpf   RBC, Urine 0-2 0 - 2 /hpf   Epithelial Cells (non renal) >10 (A) 0 - 10 /hpf   Mucus, UA Present (A) Not Estab.   Bacteria, UA Many (A) None seen/Few  CULTURE, URINE COMPREHENSIVE     Status: Abnormal   Collection Time: 02/04/24  9:01 AM   Specimen: Urine   UR  Result Value Ref Range   Urine Culture, Comprehensive Final report (A)    Organism ID, Bacteria Enterococcus faecalis (A)     Comment: Enterococci susceptible to penicillin are predictably susceptible to ampicillin, amoxicillin , ampicillin-sulbactam, amoxicillin -clavulanate,  and piperacillin-tazobactam for non-beta-lactamase producing enterococci. (CLSI 2018) For Enterococcus species, aminoglycosides (except for high-level resistance screening), cephalosporins, clindamycin, and trimethoprim-sulfamethoxazole are not effective clinically. (CLSI, M100-S26, 2016) Greater than 100,000 colony forming units per mL    ANTIMICROBIAL SUSCEPTIBILITY Comment     Comment:       ** S = Susceptible; I = Intermediate; R = Resistant **                    P = Positive; N = Negative             MICS are expressed in micrograms per mL    Antibiotic                 RSLT#1    RSLT#2    RSLT#3    RSLT#4 Ciprofloxacin                  S Levofloxacin                   S Nitrofurantoin                  S Penicillin                     S Tetracycline                   R Vancomycin                     S   NM Myocar Multi W/Spect W/Wall Motion / EF  Status: None   Collection Time: 02/05/24 10:45 AM  Result Value Ref Range   Rest HR 72.0 bpm   Rest BP 117/81 mmHg   Exercise duration (min) 4 min   Exercise duration (sec) 49 sec   Estimated workload 7.0    Peak HR 153 bpm   Peak BP 190/78 mmHg   MPHR 168 bpm   Percent HR 91.0 %   RPE 19.0    ST Depression (mm) 0 mm   Rest Nuclear Isotope Dose 10.8 mCi   Stress Nuclear Isotope Dose 32.2 mCi   SSS 8.0    SRS 6.0    SDS 2.0    TID 0.84    LV sys vol 11.0 mL   LV dias vol 54.0 46 - 106 mL   RATE 0.3    Nuc Stress EF 80 %   Angina Index 0    [facility] Treadmill Score 5   ECHOCARDIOGRAM COMPLETE     Status: None   Collection Time: 03/21/24 10:17 AM  Result Value Ref Range   Single Plane A2C EF 69.7 %   Single Plane A4C EF 60.1 %   Calc EF 63.6 %   AR max vel 2.62 cm2   AV Area VTI 2.68 cm2   AV Mean grad 4.0 mmHg   AV Peak grad 6.4 mmHg   Ao pk vel 1.26 m/s   AV Area mean vel 2.46 cm2   MV VTI 2.67 cm2   Area-P 1/2 4.15 cm2   S' Lateral 2.30 cm   Est EF 60 - 65%       Assessment & Plan: 1) Herpes zoster - Finish  valtex and increase gabapentin  300 mg from BID to four times daily and acyclovir  ointment as directed, no heat, no peroxide to oral lesions, salt water and otic susp to right ear 2) Follow up as needed   Problem List Items Addressed This Visit   None   No follow-ups on file.   Total time spent: 20 minutes  Neale Carpen, NP  05/02/2024   This document may have been prepared by [facility] Voice Recognition software and as such may include unintentional dictation errors.

## 2024-05-05 ENCOUNTER — Encounter: Payer: Self-pay | Admitting: Nurse Practitioner

## 2024-05-06 ENCOUNTER — Other Ambulatory Visit: Payer: Self-pay | Admitting: Nurse Practitioner

## 2024-05-06 DIAGNOSIS — B028 Zoster with other complications: Secondary | ICD-10-CM

## 2024-05-06 DIAGNOSIS — B029 Zoster without complications: Secondary | ICD-10-CM | POA: Insufficient documentation

## 2024-05-06 MED ORDER — PREDNISONE 50 MG PO TABS: ORAL_TABLET | ORAL | 0 refills | Status: DC

## 2024-05-09 ENCOUNTER — Ambulatory Visit (INDEPENDENT_AMBULATORY_CARE_PROVIDER_SITE_OTHER): Admitting: Nurse Practitioner

## 2024-05-09 ENCOUNTER — Ambulatory Visit: Payer: Self-pay

## 2024-05-09 VITALS — BP 110/72 | Ht 66.0 in | Wt 151.4 lb

## 2024-05-09 DIAGNOSIS — B0229 Other postherpetic nervous system involvement: Secondary | ICD-10-CM

## 2024-05-09 MED ORDER — VALACYCLOVIR HCL 1 G PO TABS: 1000.0000 mg | ORAL_TABLET | Freq: Three times a day (TID) | ORAL | 0 refills | Status: DC

## 2024-05-09 NOTE — Telephone Encounter (Signed)
 FYI Only or Action Required?: FYI only for provider.  Patient was last seen in primary care on 05/02/2024 by Glennon Sand, NP.  Called Nurse Triage reporting Rash and Herpes Zoster.  Symptoms began several weeks ago.  Interventions attempted: Other: Seen in office - taking medications.  Symptoms are: gradually worsening.  Triage Disposition: See Physician Within 24 Hours  Patient/caregiver understands and will follow disposition?: Yes                   Copied from CRM [id number]. Topic: Clinical - Red Word Triage >> May 09, 2024  8:02 AM Charlet HERO wrote: Red Word that prompted transfer to Nurse Triage: Patient is calling stating that she has rash and is in severe pain, she states that she thinks that she might have shingles she has been dealing with it for 2 weeks and she is stating that it is unbearable [facility] Reason for Disposition  SEVERE pain (e.g., excruciating)  Answer Assessment - Initial Assessment Questions 1. APPEARANCE of RASH: What does the rash look like?      On face red - painful 2. LOCATION: Where is the rash located?      face 3. ONSET: When did the rash start?      2 weeks 5. PAIN: Does the rash hurt? If Yes, ask: How bad is the pain?  (Scale 0-10; or none, mild, moderate, severe)     10/10  Protocols used: Shingles (Zoster)-A-AH

## 2024-05-09 NOTE — Telephone Encounter (Signed)
 Patient scheduled.

## 2024-05-11 ENCOUNTER — Encounter: Payer: Self-pay | Admitting: Nurse Practitioner

## 2024-05-11 NOTE — Progress Notes (Signed)
 Subjective:    Patient ID: Lacey Woodard, female    DOB: Jan 18, 1971, 53 y.o.   MRN: [id number]  HPI Presents for c/o persistent pain along the right side of the face into the scalp and around the ear after being diagnosed with shingles on 9/2. Was also seen on 9/4 and 9/5. Completed a course of Valacyclovir . Has tried Lidocaine  and a course of Prednisone . Has a few lesions near the right side of the mouth. Minimal oral lesions. Some pain in the eye area but no visual changes and no lesions on the tip of the nose. Has Gabapentin  300 mg. Was told she could increase to QID, but still taking BID.    Review of Systems  Constitutional:  Negative for fever.  HENT:  Positive for ear pain and mouth sores. Negative for trouble swallowing.   Eyes:  Positive for pain. Negative for redness and visual disturbance.  Respiratory:  Negative for cough, chest tightness, shortness of breath and wheezing.   Cardiovascular:  Negative for chest pain.  Skin:  Positive for rash.       Minimal rash at this time.   Neurological:  Negative for facial asymmetry.       Objective:   Physical Exam Vitals and nursing note reviewed.  Constitutional:      General: She is not in acute distress. HENT:     Ears:     Comments: TMs retracted bilaterally. No erythema. Skin clear around the scalp and ear right side.     Mouth/Throat:     Comments: Oral mucosa moist. No significant lesions noted. C/o pain along the right interior mouth.  Eyes:     Conjunctiva/sclera: Conjunctivae normal.     Comments: Frequently holds her hand over her right eye due to discomfort.   Neck:     Comments: Mild anterior cervical adenopathy.  Cardiovascular:     Rate and Rhythm: Normal rate and regular rhythm.  Pulmonary:     Effort: Pulmonary effort is normal.     Breath sounds: Normal breath sounds.  Musculoskeletal:     Cervical back: Neck supple.  Lymphadenopathy:     Cervical: Cervical adenopathy present.  Skin:    General:  Skin is warm and dry.     Comments: Two tiny lesions noted on the outer right side of the mouth. Otherwise skin clear.   Neurological:     Mental Status: She is alert and oriented to person, place, and time.  Psychiatric:        Mood and Affect: Mood normal.        Behavior: Behavior normal.        Thought Content: Thought content normal.    Today's Vitals   05/09/24 1625  BP: 110/72  Weight: 151 lb 6.4 oz (68.7 kg)  Height: 5' 6 (1.676 m)   Body mass index is 24.44 kg/m.         Assessment & Plan:  1. Post herpetic neuralgia (Primary) Restart another course of Valtrex . Take Gabapentin  300 in the morning and 600 mg at night.  Recommend topical Capsaicin cream OTC. Try patch test first to make sure no skin sensitivity.  Topical Lidocaine  as directed.  Complete course of Prednisone  as prescribed.   - valACYclovir  (VALTREX ) 1000 MG tablet; Take 1 tablet (1,000 mg total) by mouth 3 (three) times daily.  Dispense: 21 tablet; Refill: 0 Recommend follow up with PCP if no improvement in the pain. Discussed possible course of PHN.  Return for  Follow up with PCP.

## 2024-05-15 ENCOUNTER — Other Ambulatory Visit: Payer: Self-pay | Admitting: Family Medicine

## 2024-05-21 ENCOUNTER — Other Ambulatory Visit: Payer: Self-pay | Admitting: Family Medicine

## 2024-05-21 ENCOUNTER — Encounter: Payer: Self-pay | Admitting: Family Medicine

## 2024-05-21 MED ORDER — ALPRAZOLAM 0.5 MG PO TABS: 0.5000 mg | ORAL_TABLET | Freq: Every day | ORAL | 0 refills | Status: DC

## 2024-05-21 NOTE — Telephone Encounter (Signed)
 sent

## 2024-05-23 ENCOUNTER — Ambulatory Visit: Payer: Self-pay

## 2024-05-23 NOTE — Telephone Encounter (Signed)
 FYI Only or Action Required?: FYI only for provider.  Patient was last seen in primary care on 05/09/2024 by Mauro Elveria BROCKS, NP.  Called Nurse Triage reporting Pain.  Symptoms began several weeks ago.  Interventions attempted: Prescription medications: Valacyclovir , creams, ear drops.  Symptoms are: gradually worsening.  Triage Disposition: See HCP Within 4 Hours (Or PCP Triage)  Patient/caregiver understands and will follow disposition?: Yes. Reason for Disposition  [1] Shingles rash of face or ear AND [2] earache or ringing in the ear  Answer Assessment - Initial Assessment Questions Patient stated she was given 2 rounds of valacyclovir , creams, drops for ear and patient is still in extreme pain. Blisters went away, now they are coming back. No available appointments in PCP office, advised UC for symptoms patient stated ok they will go after work.   1. APPEARANCE of RASH: What does the rash look like?      Chin is swollen and redness  2. LOCATION: Where is the rash located?      Inside ear, down jaw and chin  3. ONSET: When did the rash start?      Stated on week 4  4. ITCHING: Does the rash itch? If Yes, ask: How bad is the itch?  (Scale 1-10; or mild, moderate, severe)     Burns, itches and tingles  5. PAIN: Does the rash hurt? If Yes, ask: How bad is the pain?  (Scale 0-10; or none, mild, moderate, severe)     7/10, ear is throbbing  6. OTHER SYMPTOMS: Do you have any other symptoms? (e.g., fever)     No  Protocols used: Shingles (Zoster)-A-AH  Copied from CRM [id number]. Topic: Clinical - Red Word Triage >> May 23, 2024 11:02 AM Shanda MATSU wrote: Red Word that prompted transfer to Nurse Triage: Patient is calling in with pain due to shingles, patient stated she is miserable and does not know what to do

## 2024-05-23 NOTE — Telephone Encounter (Signed)
 Noted

## 2024-05-24 ENCOUNTER — Other Ambulatory Visit: Payer: Self-pay | Admitting: Family Medicine

## 2024-06-09 ENCOUNTER — Encounter: Payer: Self-pay | Admitting: Family Medicine

## 2024-06-09 ENCOUNTER — Encounter

## 2024-06-09 ENCOUNTER — Ambulatory Visit: Admitting: Pulmonary Disease

## 2024-06-10 ENCOUNTER — Ambulatory Visit: Payer: Self-pay

## 2024-06-10 ENCOUNTER — Ambulatory Visit: Admitting: Family Medicine

## 2024-06-10 NOTE — Telephone Encounter (Signed)
 Patient called to report feeling like her Lexapro  isn't working. Patient endorses continued depression along with increased anxiety. Patient denies SI or HI. Scheduled patient for an acute visit for tomorrow at PCP office for 1:20 PM with another provider in office. Patient verbalized understanding.   FYI Only or Action Required?: FYI only for provider.  Patient was last seen in primary care on 05/09/2024 by Mauro Elveria BROCKS, NP.  Called Nurse Triage reporting Depression.  Symptoms began several weeks ago.  Interventions attempted: Prescription medications: Lexapro  and Rest, hydration, or home remedies.  Symptoms are: unchanged.  Triage Disposition: See Physician Within 24 Hours  Patient/caregiver understands and will follow disposition?: Yes  Copied from CRM #[id number]. Topic: Clinical - Red Word Triage >> Jun 10, 2024  8:39 AM Montie POUR wrote: Red Word that prompted transfer to Nurse Triage:  I called Kim back and she is still depressed and I made her an appointment at [facility] for today at 9 AM. FNP Jeoffrey GORMAN Barrio cannot adjust her medications. She thinks escitalopram  (LEXAPRO ) 20 MG tablet is not working. Reason for Disposition  [1] Depression AND [2] getting worse (e.g., sleeping poorly, less able to do activities of daily living)  Answer Assessment - Initial Assessment Questions 1. CONCERN: What happened that made you call today?     Increased symptoms of depression-patient states she just feels like crying and doesn't want to do anything.  2. DEPRESSION SYMPTOM SCREENING: How are you feeling overall? (e.g., decreased energy, increased sleeping or difficulty sleeping, difficulty concentrating, feelings of sadness, guilt, hopelessness, or worthlessness)     Decreased energy, trouble sleeping, feelings of sadness 3. RISK OF HARM - SUICIDAL IDEATION:  Do you ever have thoughts of hurting or killing yourself?  (e.g., yes, no, no but preoccupation with thoughts  about death)     no 4. RISK OF HARM - HOMICIDAL IDEATION:  Do you ever have thoughts of hurting or killing someone else?  (e.g., yes, no, no but preoccupation with thoughts about death)     no 5. FUNCTIONAL IMPAIRMENT: How have things been going for you overall? Have you had more difficulty than usual doing your normal daily activities?  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 SUPPORT: Who is with you now? Who do you live with? Do you have family or friends who you can talk to?      Has one friend that she talks to. Lives by herself 7. THERAPIST: Do you have a counselor or therapist? If Yes, ask: What is their name?     Yes-in family counseling.  8. STRESSORS: Has there been any new stress or recent changes in your life?     Family stress 9. ALCOHOL USE OR SUBSTANCE USE (DRUG USE): Do you drink alcohol or use any illegal drugs?     no 10. OTHER: Do you have any other physical symptoms right now? (e.g., fever)       no  Protocols used: Depression-A-AH

## 2024-06-10 NOTE — Telephone Encounter (Signed)
 Appt made.

## 2024-06-11 ENCOUNTER — Encounter: Payer: Self-pay | Admitting: Internal Medicine

## 2024-06-11 ENCOUNTER — Ambulatory Visit: Admitting: Internal Medicine

## 2024-06-11 VITALS — BP 117/78 | HR 94 | Ht 64.0 in | Wt 154.0 lb

## 2024-06-11 DIAGNOSIS — B0229 Other postherpetic nervous system involvement: Secondary | ICD-10-CM | POA: Insufficient documentation

## 2024-06-11 DIAGNOSIS — F331 Major depressive disorder, recurrent, moderate: Secondary | ICD-10-CM | POA: Insufficient documentation

## 2024-06-11 DIAGNOSIS — F411 Generalized anxiety disorder: Secondary | ICD-10-CM | POA: Diagnosis not present

## 2024-06-11 MED ORDER — CARIPRAZINE HCL 1.5 MG PO CAPS: 1.5000 mg | ORAL_CAPSULE | Freq: Every day | ORAL | 2 refills | Status: DC

## 2024-06-11 MED ORDER — ALPRAZOLAM 0.5 MG PO TABS: 0.5000 mg | ORAL_TABLET | Freq: Every day | ORAL | 1 refills | Status: DC

## 2024-06-11 NOTE — Assessment & Plan Note (Addendum)
 Uncontrolled Likely worse currently due to family conflict and her worry about her mother's health On Lexapro  20 mg QD Has tried Wellbutrin, Effexor and Abilify  without much improvement Added Vraylar 1.5 mg QD Advised to perform stress reduction exercises, such as yoga or tai chi Advised to engage in activities of liking on routine basis Continue BH therapy

## 2024-06-11 NOTE — Assessment & Plan Note (Signed)
 Had rash on the face in 09/25 -rash resolved now Has completed oral valacyclovir  Continue gabapentin  for neuropathic pain

## 2024-06-11 NOTE — Assessment & Plan Note (Signed)
 Uncontrolled On Lexapro  20 mg QD and Xanax  0.5 mg nightly PDMP reviewed-refilled Xanax  Added Vraylar for persistent depression

## 2024-06-11 NOTE — Patient Instructions (Addendum)
 Please start taking Vraylar as prescribed.  Please continue taking Lexapro  as prescribed.  Please continue to take other medications as prescribed.

## 2024-06-11 NOTE — Progress Notes (Signed)
 Established Patient Office Visit  Subjective:  Patient ID: Lacey Woodard, female    DOB: July 29, 1971  Age: 53 y.o. MRN: [id number]  CC:  Chief Complaint  Patient presents with   Depression    Patient doesn't feel her anti depressants are working    HPI Lacey Woodard is a 53 y.o. female with past medical history of COPD, MDD and GAD who presents for f/u of MDD.  MDD: She reports anhedonia and lack of interest in routine activities, which are chronic.  She has episodes of anxiety at times.  She takes Lexapro  20 mg QD currently.  She also has Xanax  0.5 mg nightly for anxiety and insomnia.  She has tried Wellbutrin and Effexor in the past.  She was also given Abilify  by PCP, but did not see any improvement in her symptoms.  She reports conflict with his son's girlfriend, and has been getting BH therapy at [facility] in [place].  She is also worried about her mother's health.  She currently denies SI or HI.  Past Medical History:  Diagnosis Date   Acid reflux    Anxiety    Depression    Emphysema lung (HCC)    Hyperlipidemia     Past Surgical History:  Procedure Laterality Date   ABDOMINAL HYSTERECTOMY     bilateral inguinal hernia repair     COLONOSCOPY WITH PROPOFOL  N/A 08/08/2022   Procedure: COLONOSCOPY WITH PROPOFOL ;  Surgeon: Cindie Carlin POUR, DO;  Location: [facility];  Service: Endoscopy;  Laterality: N/A;  1:00pm, asa 2    Family History  Problem Relation Age of Onset   Colon cancer Maternal Aunt     Social History   Socioeconomic History   Marital status: Divorced    Spouse name: Not on file   Number of children: Not on file   Years of education: Not on file   Highest education level: Some college, no degree  Occupational History   Not on file  Tobacco Use   Smoking status: Every Day    Current packs/day: 1.00    Average packs/day: 1 pack/day for 35.8 years (35.8 ttl pk-yrs)    Types: Cigarettes    Start date: 58   Smokeless  tobacco: Never  Vaping Use   Vaping status: Never Used  Substance and Sexual Activity   Alcohol use: No   Drug use: No   Sexual activity: Yes    Birth control/protection: Surgical, Post-menopausal    Comment: hyst  Other Topics Concern   Not on file  Social History Narrative   Not on file   Social Drivers of Health   Financial Resource Strain: Medium Risk (06/10/2024)   Overall Financial Resource Strain (CARDIA)    Difficulty of Paying Living Expenses: Somewhat hard  Food Insecurity: No Food Insecurity (06/10/2024)   Hunger Vital Sign    Worried About Running Out of Food in the Last Year: Never true    Ran Out of Food in the Last Year: Never true  Transportation Needs: No Transportation Needs (06/10/2024)   PRAPARE - Administrator, Civil Service (Medical): No    Lack of Transportation (Non-Medical): No  Physical Activity: Sufficiently Active (06/10/2024)   Exercise Vital Sign    Days of Exercise per Week: 3 days    Minutes of Exercise per Session: 60 min  Stress: Stress Concern Present (06/10/2024)   [employer] of Occupational Health - Occupational Stress Questionnaire    Feeling of Stress: Very  much  Social Connections: Socially Isolated (06/10/2024)   Social Connection and Isolation Panel    Frequency of Communication with Friends and Family: Three times a week    Frequency of Social Gatherings with Friends and Family: Once a week    Attends Religious Services: Never    Database administrator or Organizations: No    Attends Engineer, structural: Not on file    Marital Status: Divorced  Intimate Partner Violence: Not At Risk (09/12/2022)   Humiliation, Afraid, Rape, and Kick questionnaire    Fear of Current or Ex-Partner: No    Emotionally Abused: No    Physically Abused: No    Sexually Abused: No    Outpatient Medications Prior to Visit  Medication Sig Dispense Refill   acyclovir  ointment (ZOVIRAX ) 5 % Apply 1 Application topically  every 3 (three) hours. 30 g 1   albuterol  (VENTOLIN  HFA) 108 (90 Base) MCG/ACT inhaler Inhale 2 puffs into the lungs every 6 (six) hours as needed for wheezing or shortness of breath. 8 g 0   Cholecalciferol (VITAMIN D3) 25 MCG (1000 UT) CAPS Take 1,000 Units by mouth daily.     cyclobenzaprine (FLEXERIL) 5 MG tablet SMARTSIG:1 Tablet(s) By Mouth 1-3 Times Daily     escitalopram  (LEXAPRO ) 20 MG tablet Take 1 tablet by mouth once daily 30 tablet 0   estradiol  (ESTRACE ) 2 MG tablet Take 1 tablet (2 mg total) by mouth daily. 30 tablet 6   fluticasone (FLONASE) 50 MCG/ACT nasal spray Place 2 sprays into both nostrils daily.     gabapentin  (NEURONTIN ) 300 MG capsule TAKE 1 CAPSULE BY MOUTH TWICE DAILY AS NEEDED 90 capsule 0   lidocaine  (XYLOCAINE ) 2 % solution Use as directed 10 mLs in the mouth or throat every 3 (three) hours as needed. 100 mL 0   magnesium oxide (MAG-OX) 400 (240 Mg) MG tablet Take 400 mg by mouth at bedtime.     nystatin  (MYCOSTATIN ) 100000 UNIT/ML suspension Take 5 mLs (500,000 Units total) by mouth 4 (four) times daily. 60 mL 0   ondansetron  (ZOFRAN -ODT) 4 MG disintegrating tablet Take 4 mg by mouth every 8 (eight) hours as needed.     promethazine -dextromethorphan (PROMETHAZINE -DM) 6.25-15 MG/5ML syrup Take 5 mLs by mouth 4 (four) times daily as needed for cough. 118 mL 0   Tiotropium Bromide-Olodaterol (STIOLTO RESPIMAT ) 2.5-2.5 MCG/ACT AERS Inhale 2 puffs into the lungs daily. 1 each 11   valACYclovir  (VALTREX ) 1000 MG tablet Take 1 tablet (1,000 mg total) by mouth 3 (three) times daily. 21 tablet 0   ALPRAZolam  (XANAX ) 0.5 MG tablet Take 1 tablet (0.5 mg total) by mouth at bedtime. 30 tablet 0   azithromycin  (ZITHROMAX ) 250 MG tablet Take 2 tablets on day 1, then 1 tablet daily on days 2 through 5 6 tablet 0   neomycin -polymyxin-hydrocortisone (CORTISPORIN) OTIC solution Place 4 drops into the right ear 4 (four) times daily. 10 mL 0   predniSONE  (DELTASONE ) 50 MG tablet Take 1  tablet by mouth daily in AM with food x 5 days to decrease inflammation 5 tablet 0   No facility-administered medications prior to visit.    No Known Allergies  ROS Review of Systems  Constitutional:  Negative for chills and fever.  HENT:  Negative for congestion, sinus pressure, sinus pain and sore throat.   Eyes:  Negative for pain and discharge.  Respiratory:  Negative for cough and shortness of breath.   Cardiovascular:  Negative for chest pain and  palpitations.  Gastrointestinal:  Negative for abdominal pain, diarrhea, nausea and vomiting.  Endocrine: Negative for polydipsia and polyuria.  Genitourinary:  Negative for dysuria and hematuria.  Musculoskeletal:  Negative for neck pain and neck stiffness.  Skin:  Negative for rash.  Neurological:  Negative for dizziness and weakness.  Psychiatric/Behavioral:  Positive for dysphoric mood and sleep disturbance. Negative for agitation and behavioral problems. The patient is nervous/anxious.       Objective:    Physical Exam Vitals reviewed.  Constitutional:      General: She is not in acute distress.    Appearance: She is not diaphoretic.  HENT:     Head: Normocephalic and atraumatic.     Nose: Nose normal. No congestion.     Mouth/Throat:     Mouth: Mucous membranes are moist.     Pharynx: No posterior oropharyngeal erythema.  Eyes:     General: No scleral icterus.    Extraocular Movements: Extraocular movements intact.  Cardiovascular:     Rate and Rhythm: Normal rate and regular rhythm.     Heart sounds: Normal heart sounds. No murmur heard. Pulmonary:     Breath sounds: Normal breath sounds. No wheezing or rales.  Musculoskeletal:     Cervical back: Neck supple. No tenderness.     Right lower leg: No edema.     Left lower leg: No edema.  Skin:    General: Skin is warm.     Findings: No rash.  Neurological:     General: No focal deficit present.     Mental Status: She is alert and oriented to person, place, and  time.  Psychiatric:        Mood and Affect: Mood is depressed.        Behavior: Behavior is cooperative.     BP 117/78   Pulse 94   Ht 5' 4 (1.626 m)   Wt 154 lb (69.9 kg)   SpO2 100%   BMI 26.43 kg/m  Wt Readings from Last 3 Encounters:  06/11/24 154 lb (69.9 kg)  05/09/24 151 lb 6.4 oz (68.7 kg)  05/02/24 152 lb (68.9 kg)    Lab Results  Component Value Date   TSH 2.670 07/04/2023   Lab Results  Component Value Date   WBC 7.7 07/04/2023   HGB 15.1 07/04/2023   HCT 45.4 07/04/2023   MCV 96 07/04/2023   PLT 274 07/04/2023   Lab Results  Component Value Date   NA 143 07/04/2023   K 4.4 07/04/2023   CO2 22 07/04/2023   GLUCOSE 88 07/04/2023   BUN 9 07/04/2023   CREATININE 1.04 (H) 07/04/2023   BILITOT <0.2 07/04/2023   ALKPHOS 128 (H) 07/04/2023   AST 24 07/04/2023   ALT 24 07/04/2023   PROT 6.8 07/04/2023   ALBUMIN 4.4 07/04/2023   CALCIUM  9.4 07/04/2023   EGFR 65 07/04/2023   Lab Results  Component Value Date   CHOL 232 (H) 07/04/2023   Lab Results  Component Value Date   HDL 93 07/04/2023   Lab Results  Component Value Date   LDLCALC 118 (H) 07/04/2023   Lab Results  Component Value Date   TRIG 125 07/04/2023   Lab Results  Component Value Date   CHOLHDL 2.5 07/04/2023   Lab Results  Component Value Date   HGBA1C 5.5 07/04/2023      Assessment & Plan:   Problem List Items Addressed This Visit       Nervous and Auditory  Postherpetic neuralgia   Had rash on the face in 09/25 -rash resolved now Has completed oral valacyclovir  Continue gabapentin  for neuropathic pain        Other   Moderate episode of recurrent major depressive disorder (HCC) - Primary   Uncontrolled Likely worse currently due to family conflict and her worry about her mother's health On Lexapro  20 mg QD Has tried Wellbutrin, Effexor and Abilify  without much improvement Added Vraylar 1.5 mg QD Advised to perform stress reduction exercises, such as yoga or  tai chi Advised to engage in activities of liking on routine basis Continue BH therapy       Relevant Medications   cariprazine (VRAYLAR) 1.5 MG capsule   ALPRAZolam  (XANAX ) 0.5 MG tablet (Start on 06/20/2024)   GAD (generalized anxiety disorder)   Uncontrolled On Lexapro  20 mg QD and Xanax  0.5 mg nightly PDMP reviewed-refilled Xanax  Added Vraylar for persistent depression       Relevant Medications   ALPRAZolam  (XANAX ) 0.5 MG tablet (Start on 06/20/2024)    Meds ordered this encounter  Medications   cariprazine (VRAYLAR) 1.5 MG capsule    Sig: Take 1 capsule (1.5 mg total) by mouth daily.    Dispense:  30 capsule    Refill:  2   ALPRAZolam  (XANAX ) 0.5 MG tablet    Sig: Take 1 tablet (0.5 mg total) by mouth at bedtime.    Dispense:  30 tablet    Refill:  1    Follow-up: Return in about 2 months (around 08/11/2024).    Suzzane MARLA Blanch, MD

## 2024-06-15 ENCOUNTER — Encounter: Payer: Self-pay | Admitting: Internal Medicine

## 2024-06-20 ENCOUNTER — Other Ambulatory Visit: Payer: Self-pay | Admitting: Family Medicine

## 2024-06-24 ENCOUNTER — Encounter: Payer: Self-pay | Admitting: Pulmonary Disease

## 2024-06-24 DIAGNOSIS — R0609 Other forms of dyspnea: Secondary | ICD-10-CM

## 2024-06-25 NOTE — Telephone Encounter (Signed)
**Note De-identified  [place] Obfuscation** Please advise 

## 2024-06-27 ENCOUNTER — Other Ambulatory Visit: Payer: Self-pay | Admitting: Family Medicine

## 2024-06-30 ENCOUNTER — Telehealth: Admitting: Physician Assistant

## 2024-06-30 ENCOUNTER — Telehealth

## 2024-06-30 DIAGNOSIS — K117 Disturbances of salivary secretion: Secondary | ICD-10-CM

## 2024-06-30 NOTE — Patient Instructions (Signed)
 Lacey Woodard, thank you for joining Lacey Shuck, PA-C for today's virtual visit.  While this provider is not your primary care provider (PCP), if your PCP is located in our provider database this encounter information will be shared with them immediately following your visit.   A [facility] MyChart account gives you access to today's visit and all your visits, tests, and labs performed at [facility]  click here if you don't have a [facility] MyChart account or go to [email or web address]  Consent: (Patient) Lacey Woodard provided verbal consent for this virtual visit at the beginning of the encounter.  Current Medications:  Current Outpatient Medications:    acyclovir  ointment (ZOVIRAX ) 5 %, Apply 1 Application topically every 3 (three) hours., Disp: 30 g, Rfl: 1   albuterol  (VENTOLIN  HFA) 108 (90 Base) MCG/ACT inhaler, Inhale 2 puffs into the lungs every 6 (six) hours as needed for wheezing or shortness of breath., Disp: 8 g, Rfl: 0   ALPRAZolam  (XANAX ) 0.5 MG tablet, Take 1 tablet (0.5 mg total) by mouth at bedtime., Disp: 30 tablet, Rfl: 1   cariprazine (VRAYLAR) 1.5 MG capsule, Take 1 capsule (1.5 mg total) by mouth daily., Disp: 30 capsule, Rfl: 2   Cholecalciferol (VITAMIN D3) 25 MCG (1000 UT) CAPS, Take 1,000 Units by mouth daily., Disp: , Rfl:    cyclobenzaprine (FLEXERIL) 5 MG tablet, SMARTSIG:1 Tablet(s) By Mouth 1-3 Times Daily, Disp: , Rfl:    escitalopram  (LEXAPRO ) 20 MG tablet, Take 1 tablet by mouth once daily, Disp: 30 tablet, Rfl: 0   estradiol  (ESTRACE ) 2 MG tablet, Take 1 tablet (2 mg total) by mouth daily., Disp: 30 tablet, Rfl: 6   fluticasone (FLONASE) 50 MCG/ACT nasal spray, Place 2 sprays into both nostrils daily., Disp: , Rfl:    gabapentin  (NEURONTIN ) 300 MG capsule, TAKE 1 CAPSULE BY MOUTH TWICE DAILY AS NEEDED, Disp: 90 capsule, Rfl: 0   lidocaine  (XYLOCAINE ) 2 % solution, Use as directed 10 mLs in the mouth or throat every 3 (three)  hours as needed., Disp: 100 mL, Rfl: 0   magnesium oxide (MAG-OX) 400 (240 Mg) MG tablet, Take 400 mg by mouth at bedtime., Disp: , Rfl:    nystatin  (MYCOSTATIN ) 100000 UNIT/ML suspension, Take 5 mLs (500,000 Units total) by mouth 4 (four) times daily., Disp: 60 mL, Rfl: 0   ondansetron  (ZOFRAN -ODT) 4 MG disintegrating tablet, Take 4 mg by mouth every 8 (eight) hours as needed., Disp: , Rfl:    promethazine -dextromethorphan (PROMETHAZINE -DM) 6.25-15 MG/5ML syrup, Take 5 mLs by mouth 4 (four) times daily as needed for cough., Disp: 118 mL, Rfl: 0   Tiotropium Bromide-Olodaterol (STIOLTO RESPIMAT ) 2.5-2.5 MCG/ACT AERS, Inhale 2 puffs into the lungs daily., Disp: 1 each, Rfl: 11   valACYclovir  (VALTREX ) 1000 MG tablet, Take 1 tablet (1,000 mg total) by mouth 3 (three) times daily., Disp: 21 tablet, Rfl: 0   Medications ordered in this encounter:  No orders of the defined types were placed in this encounter.    *If you need refills on other medications prior to your next appointment, please contact your pharmacy*  Follow-Up: Call back or seek an in-person evaluation if the symptoms worsen or if the condition fails to improve as anticipated.   Virtual Care [phone]  Other Instructions Follow up with primary provider in 24-48 hours. Report to nearest ER with any worsening symptoms.    If you have been instructed to have an in-person evaluation today at a local Urgent Care facility,  please use the link below. It will take you to a list of all of our available [facility], including address, phone number and hours of operation. Please do not delay care.  [facility]  If you or a family member do not have a primary care provider, use the link below to schedule a visit and establish care. When you choose a [facility] primary care physician or advanced practice provider, you gain a long-term partner in health. Find a Primary Care Provider  Learn more  about [facility]'s in-office and virtual care options: [facility] - Get Care Now

## 2024-06-30 NOTE — Progress Notes (Signed)
 Virtual Visit Consent   Lacey Woodard, you are scheduled for a virtual visit with a [facility] provider today. Just as with appointments in the office, your consent must be obtained to participate. Your consent will be active for this visit and any virtual visit you may have with one of our providers in the next 365 days. If you have a MyChart account, a copy of this consent can be sent to you electronically.  As this is a virtual visit, video technology does not allow for your provider to perform a traditional examination. This may limit your provider's ability to fully assess your condition. If your provider identifies any concerns that need to be evaluated in person or the need to arrange testing (such as labs, EKG, etc.), we will make arrangements to do so. Although advances in technology are sophisticated, we cannot ensure that it will always work on either your end or our end. If the connection with a video visit is poor, the visit may have to be switched to a telephone visit. With either a video or telephone visit, we are not always able to ensure that we have a secure connection.  By engaging in this virtual visit, you consent to the provision of healthcare and authorize for your insurance to be billed (if applicable) for the services provided during this visit. Depending on your insurance coverage, you may receive a charge related to this service.  I need to obtain your verbal consent now. Are you willing to proceed with your visit today? Lacey Woodard has provided verbal consent on 06/30/2024 for a virtual visit (video or telephone). [name], [place]  Date: 06/30/2024 7:08 PM   Virtual Visit via Video Note   I, [name], connected with  Lacey Woodard  ([id number], May 29, 1971) on 06/30/24 at  7:00 PM EST by a video-enabled telemedicine application and verified that I am speaking with the correct person using two identifiers.  Location: Patient: Virtual Visit Location  Patient: Home Provider: Virtual Visit Location Provider: Home Office   I discussed the limitations of evaluation and management by telemedicine and the availability of in person appointments. The patient expressed understanding and agreed to proceed.    History of Present Illness: Lacey Woodard is a 53 y.o. who identifies as a female who was assigned female at birth, and is being seen today for dry mouth. Patient states she didn't start having dry mouth until she got dentures. She's now had then 2 years and her symptoms have not improved. She denies any pain or other issues she just states the symptoms have not improved.   HPI: HPI  Problems:  Patient Active Problem List   Diagnosis Date Noted   Moderate episode of recurrent major depressive disorder (HCC) 06/11/2024   GAD (generalized anxiety disorder) 06/11/2024   Postherpetic neuralgia 06/11/2024   Shingles 05/06/2024   Upper respiratory infection 02/20/2024   DOE (dyspnea on exertion) 01/09/2024   Nicotine abuse 01/09/2024   Palpitations 01/09/2024   Emphysema lung (HCC) 08/20/2023   Hyperlipidemia LDL goal <70 08/20/2023   Anxiety and depression 06/25/2023   Mixed stress and urge urinary incontinence 12/01/2022   Current use of estrogen therapy 10/11/2022   Hair thinning 09/12/2022   Moody 09/12/2022   S/P hysterectomy with oophorectomy 09/12/2022   Dyspareunia in female 09/12/2022   Vaginal dryness 09/12/2022   Night sweats 09/12/2022    Allergies: No Known Allergies Medications:  Current Outpatient Medications:    acyclovir  ointment (ZOVIRAX ) 5 %,  Apply 1 Application topically every 3 (three) hours., Disp: 30 g, Rfl: 1   albuterol  (VENTOLIN  HFA) 108 (90 Base) MCG/ACT inhaler, Inhale 2 puffs into the lungs every 6 (six) hours as needed for wheezing or shortness of breath., Disp: 8 g, Rfl: 0   ALPRAZolam  (XANAX ) 0.5 MG tablet, Take 1 tablet (0.5 mg total) by mouth at bedtime., Disp: 30 tablet, Rfl: 1   cariprazine  (VRAYLAR) 1.5 MG capsule, Take 1 capsule (1.5 mg total) by mouth daily., Disp: 30 capsule, Rfl: 2   Cholecalciferol (VITAMIN D3) 25 MCG (1000 UT) CAPS, Take 1,000 Units by mouth daily., Disp: , Rfl:    cyclobenzaprine (FLEXERIL) 5 MG tablet, SMARTSIG:1 Tablet(s) By Mouth 1-3 Times Daily, Disp: , Rfl:    escitalopram  (LEXAPRO ) 20 MG tablet, Take 1 tablet by mouth once daily, Disp: 30 tablet, Rfl: 0   estradiol  (ESTRACE ) 2 MG tablet, Take 1 tablet (2 mg total) by mouth daily., Disp: 30 tablet, Rfl: 6   fluticasone (FLONASE) 50 MCG/ACT nasal spray, Place 2 sprays into both nostrils daily., Disp: , Rfl:    gabapentin  (NEURONTIN ) 300 MG capsule, TAKE 1 CAPSULE BY MOUTH TWICE DAILY AS NEEDED, Disp: 90 capsule, Rfl: 0   lidocaine  (XYLOCAINE ) 2 % solution, Use as directed 10 mLs in the mouth or throat every 3 (three) hours as needed., Disp: 100 mL, Rfl: 0   magnesium oxide (MAG-OX) 400 (240 Mg) MG tablet, Take 400 mg by mouth at bedtime., Disp: , Rfl:    nystatin  (MYCOSTATIN ) 100000 UNIT/ML suspension, Take 5 mLs (500,000 Units total) by mouth 4 (four) times daily., Disp: 60 mL, Rfl: 0   ondansetron  (ZOFRAN -ODT) 4 MG disintegrating tablet, Take 4 mg by mouth every 8 (eight) hours as needed., Disp: , Rfl:    promethazine -dextromethorphan (PROMETHAZINE -DM) 6.25-15 MG/5ML syrup, Take 5 mLs by mouth 4 (four) times daily as needed for cough., Disp: 118 mL, Rfl: 0   Tiotropium Bromide-Olodaterol (STIOLTO RESPIMAT ) 2.5-2.5 MCG/ACT AERS, Inhale 2 puffs into the lungs daily., Disp: 1 each, Rfl: 11   valACYclovir  (VALTREX ) 1000 MG tablet, Take 1 tablet (1,000 mg total) by mouth 3 (three) times daily., Disp: 21 tablet, Rfl: 0  Observations/Objective: Patient is well-developed, well-nourished in no acute distress.  Resting comfortably  at home.  Head is normocephalic, atraumatic.  No labored breathing.  Speech is clear and coherent with logical content.  Patient is alert and oriented at baseline.    Assessment  and Plan: 1. Xerostomia (Primary)  Counseled patient on adequate hydration, and using non alcohol based mouth wash such as biotene. Advised her to follow up with her primary provider for labs to rule out other causes she was in agreement with this plan. Also advised on hard candy/lozenges to help with dryness. She agreed to this plan and counseling provided.   Follow Up Instructions: I discussed the assessment and treatment plan with the patient. The patient was provided an opportunity to ask questions and all were answered. The patient agreed with the plan and demonstrated an understanding of the instructions.  A copy of instructions were sent to the patient via MyChart unless otherwise noted below.     The patient was advised to call back or seek an in-person evaluation if the symptoms worsen or if the condition fails to improve as anticipated.    [name], PA-C

## 2024-07-03 ENCOUNTER — Ambulatory Visit: Payer: Self-pay

## 2024-07-03 VITALS — BP 104/72 | HR 91 | Ht 64.0 in | Wt 154.0 lb

## 2024-07-03 DIAGNOSIS — E559 Vitamin D deficiency, unspecified: Secondary | ICD-10-CM

## 2024-07-03 DIAGNOSIS — F419 Anxiety disorder, unspecified: Secondary | ICD-10-CM

## 2024-07-03 DIAGNOSIS — E038 Other specified hypothyroidism: Secondary | ICD-10-CM

## 2024-07-03 DIAGNOSIS — F32A Depression, unspecified: Secondary | ICD-10-CM

## 2024-07-03 DIAGNOSIS — R7301 Impaired fasting glucose: Secondary | ICD-10-CM

## 2024-07-03 DIAGNOSIS — K117 Disturbances of salivary secretion: Secondary | ICD-10-CM

## 2024-07-03 DIAGNOSIS — E782 Mixed hyperlipidemia: Secondary | ICD-10-CM

## 2024-07-03 MED ORDER — PILOCARPINE HCL 5 MG PO TABS
5.0000 mg | ORAL_TABLET | Freq: Two times a day (BID) | ORAL | 5 refills | Status: DC
Start: 2024-07-03 — End: 2024-07-11

## 2024-07-03 MED ORDER — ARIPIPRAZOLE 10 MG PO TABS: 10.0000 mg | ORAL_TABLET | Freq: Every day | ORAL | 5 refills | Status: DC

## 2024-07-03 NOTE — Progress Notes (Signed)
 Established Patient Office Visit  Subjective   Patient ID: Lacey Woodard, female    DOB: 1970/11/03  Age: 53 y.o. MRN: [id number]  Chief Complaint  Patient presents with   Medical Management of Chronic Issues    Pt states dry throat and mouth due to dentures     HPI Discussed the use of AI scribe software for clinical note transcription with the patient, who gave verbal consent to proceed.  History of Present Illness    Lacey Woodard is a 53 year old female who presents with dry mouth and dry throat.  Xerostomia and pharyngeal dryness - Dry mouth and dry throat present for the past two years - Onset occurred after dental extractions - Uses dentures - Over-the-counter remedies including mouthwash, spray, and mints provide only temporary relief - Frequent drinking to alleviate dryness  Medication effects - Previously took Vraylar, discontinued due to restlessness - Previously took Abilify , discontinued due to exacerbation of dry mouth and throat symptoms     Patient Active Problem List   Diagnosis Date Noted   Vitamin D  deficiency 07/09/2024   TSH (thyroid-stimulating hormone deficiency) 07/09/2024   IFG (impaired fasting glucose) 07/09/2024   Xerostomia 07/09/2024   Moderate episode of recurrent major depressive disorder (HCC) 06/11/2024   GAD (generalized anxiety disorder) 06/11/2024   Postherpetic neuralgia 06/11/2024   Shingles 05/06/2024   Upper respiratory infection 02/20/2024   DOE (dyspnea on exertion) 01/09/2024   Nicotine abuse 01/09/2024   Palpitations 01/09/2024   Emphysema lung (HCC) 08/20/2023   Hyperlipidemia LDL goal <70 08/20/2023   Anxiety and depression 06/25/2023   Mixed stress and urge urinary incontinence 12/01/2022   Current use of estrogen therapy 10/11/2022   Hair thinning 09/12/2022   Moody 09/12/2022   S/P hysterectomy with oophorectomy 09/12/2022   Dyspareunia in female 09/12/2022   Vaginal dryness 09/12/2022   Night  sweats 09/12/2022      ROS    Objective:     BP 104/72   Pulse 91   Ht 5' 4 (1.626 m)   Wt 154 lb (69.9 kg)   SpO2 100%   BMI 26.43 kg/m  BP Readings from Last 3 Encounters:  07/03/24 104/72  06/11/24 117/78  05/09/24 110/72   Wt Readings from Last 3 Encounters:  07/03/24 154 lb (69.9 kg)  06/11/24 154 lb (69.9 kg)  05/09/24 151 lb 6.4 oz (68.7 kg)     Physical Exam Vitals and nursing note reviewed.  Constitutional:      Appearance: Normal appearance.  HENT:     Head: Normocephalic.     Right Ear: Tympanic membrane, ear canal and external ear normal.     Left Ear: Tympanic membrane, ear canal and external ear normal.     Nose: Nose normal.     Mouth/Throat:     Mouth: Mucous membranes are dry.     Pharynx: Oropharynx is clear.  Eyes:     Extraocular Movements: Extraocular movements intact.     Pupils: Pupils are equal, round, and reactive to light.  Cardiovascular:     Rate and Rhythm: Normal rate and regular rhythm.  Pulmonary:     Effort: Pulmonary effort is normal.     Breath sounds: Normal breath sounds.  Musculoskeletal:     Cervical back: Normal range of motion and neck supple.  Skin:    General: Skin is warm and dry.  Neurological:     Mental Status: She is alert and oriented to person, place,  and time.  Psychiatric:        Mood and Affect: Mood normal.        Thought Content: Thought content normal.      No results found for any visits on 07/03/24.    The 10-year ASCVD risk score (Arnett DK, et al., 2019) is: 1.9%    Assessment & Plan:   Problem List Items Addressed This Visit       Endocrine   TSH (thyroid-stimulating hormone deficiency)   Recheck thyroid levels.        Relevant Orders   TSH + free T4   IFG (impaired fasting glucose)   - Reordered lab tests for impaired fasting glucose. - Advised to come in the morning for lab work, allowing water and black coffee, but no cream or sugar.      Relevant Orders   Hemoglobin  A1c     Other   Anxiety and depression - Primary      06/11/2024    1:13 PM 05/09/2024    4:26 PM 02/20/2024    9:50 AM 06/25/2023    1:08 PM  GAD 7 : Generalized Anxiety Score  Nervous, Anxious, on Edge 3 3 1 2   Control/stop worrying 3 3 1 3   Worry too much - different things 3 3 1 3   Trouble relaxing 3 3 1 3   Restless 3 3 1 3   Easily annoyed or irritable 1 1 1 1   Afraid - awful might happen 1 1 1 1   Total GAD 7 Score 17 17 7 16   Anxiety Difficulty Not difficult at all Not difficult at all Somewhat difficult Somewhat difficult   Flowsheet Row Office Visit from 06/11/2024 in [facility]  PHQ-9 Total Score 17    Stable with Lexapro  20 mg once daily, and Abilify  10 mg.        Relevant Medications   ARIPiprazole  (ABILIFY ) 10 MG tablet   Vitamin D  deficiency   Recheck vitamin D  levels.       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 Filocarpine prescribed for management. - Prescribed Filocarpine 5 mg, sent prescription to [facility] in [place]. - Advised to drink plenty of water.      Relevant Medications   pilocarpine (SALAGEN) 5 MG tablet   Other Visit Diagnoses       Mixed hyperlipidemia       Relevant Orders   Lipid panel   CMP14+EGFR   CBC with Differential/Platelet       No follow-ups on file.    Leita Longs, FNP

## 2024-07-09 DIAGNOSIS — K117 Disturbances of salivary secretion: Secondary | ICD-10-CM | POA: Insufficient documentation

## 2024-07-09 DIAGNOSIS — E038 Other specified hypothyroidism: Secondary | ICD-10-CM | POA: Insufficient documentation

## 2024-07-09 DIAGNOSIS — R7301 Impaired fasting glucose: Secondary | ICD-10-CM | POA: Insufficient documentation

## 2024-07-09 DIAGNOSIS — E559 Vitamin D deficiency, unspecified: Secondary | ICD-10-CM | POA: Insufficient documentation

## 2024-07-09 NOTE — Assessment & Plan Note (Signed)
Recheck thyroid levels 

## 2024-07-09 NOTE — Assessment & Plan Note (Signed)
 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 Filocarpine prescribed for management. - Prescribed Filocarpine 5 mg, sent prescription to [facility] in [place]. - Advised to drink plenty of water.

## 2024-07-09 NOTE — Assessment & Plan Note (Signed)
 Recheck vitamin D  levels

## 2024-07-09 NOTE — Assessment & Plan Note (Signed)
    06/11/2024    1:13 PM 05/09/2024    4:26 PM 02/20/2024    9:50 AM 06/25/2023    1:08 PM  GAD 7 : Generalized Anxiety Score  Nervous, Anxious, on Edge 3 3 1 2   Control/stop worrying 3 3 1 3   Worry too much - different things 3 3 1 3   Trouble relaxing 3 3 1 3   Restless 3 3 1 3   Easily annoyed or irritable 1 1 1 1   Afraid - awful might happen 1 1 1 1   Total GAD 7 Score 17 17 7 16   Anxiety Difficulty Not difficult at all Not difficult at all Somewhat difficult Somewhat difficult   Flowsheet Row Office Visit from 06/11/2024 in [facility]  PHQ-9 Total Score 17    Stable with Lexapro  20 mg once daily, and Abilify  10 mg.

## 2024-07-09 NOTE — Assessment & Plan Note (Signed)
-   Reordered lab tests for impaired fasting glucose. - Advised to come in the morning for lab work, allowing water and black coffee, but no cream or sugar.

## 2024-07-11 ENCOUNTER — Other Ambulatory Visit: Payer: Self-pay

## 2024-07-11 ENCOUNTER — Encounter: Payer: Self-pay | Admitting: Family Medicine

## 2024-07-11 DIAGNOSIS — K117 Disturbances of salivary secretion: Secondary | ICD-10-CM

## 2024-07-11 MED ORDER — PILOCARPINE HCL 5 MG PO TABS: 5.0000 mg | ORAL_TABLET | Freq: Three times a day (TID) | ORAL | 5 refills | Status: AC

## 2024-07-15 ENCOUNTER — Encounter: Payer: Self-pay | Admitting: Obstetrics and Gynecology

## 2024-07-15 ENCOUNTER — Ambulatory Visit: Admitting: Obstetrics and Gynecology

## 2024-07-15 VITALS — BP 103/71 | HR 92 | Ht 65.75 in | Wt 153.0 lb

## 2024-07-15 DIAGNOSIS — R3915 Urgency of urination: Secondary | ICD-10-CM | POA: Diagnosis not present

## 2024-07-15 DIAGNOSIS — N8110 Cystocele, unspecified: Secondary | ICD-10-CM | POA: Diagnosis not present

## 2024-07-15 DIAGNOSIS — N393 Stress incontinence (female) (male): Secondary | ICD-10-CM | POA: Diagnosis not present

## 2024-07-15 DIAGNOSIS — R350 Frequency of micturition: Secondary | ICD-10-CM | POA: Insufficient documentation

## 2024-07-15 LAB — POCT URINALYSIS DIP (CLINITEK)
Bilirubin, UA: NEGATIVE
Blood, UA: NEGATIVE
Glucose, UA: NEGATIVE mg/dL
Ketones, POC UA: NEGATIVE mg/dL
Leukocytes, UA: NEGATIVE
Nitrite, UA: NEGATIVE
POC PROTEIN,UA: NEGATIVE
Spec Grav, UA: 1.02 (ref 1.010–1.025)
Urobilinogen, UA: 0.2 U/dL
pH, UA: 7.5 (ref 5.0–8.0)

## 2024-07-15 NOTE — Assessment & Plan Note (Signed)
 - For treatment of stress urinary incontinence,  non-surgical options include expectant management, weight loss, physical therapy, as well as a pessary.  Surgical options include a midurethral sling, Burch urethropexy, and transurethral injection of a bulking agent. - She demonstrated leakage with cough on exam today - She is interested in a sling. Plan for surgery: Exam under anesthesia, midurethral sling, cystoscopy, possible anterior repair  - We reviewed the patient's specific anatomic and functional findings, with the assistance of diagrams, and together finalized the above procedure. The planned surgical procedures were discussed along with the surgical risks outlined below, which were also provided on a detailed handout. Additional treatment options including expectant management, conservative management, medical management were discussed where appropriate.  We reviewed the benefits and risks of each treatment option.   General Surgical Risks: For all procedures, there are risks of bleeding, infection, damage to surrounding organs including but not limited to bowel, bladder, blood vessels, ureters and nerves, and need for further surgery if an injury were to occur. These risks are all low with minimally invasive surgery.   There are risks of numbness and weakness at any body site or buttock/rectal pain.  It is possible that baseline pain can be worsened by surgery, either with or without mesh. If surgery is vaginal, there is also a low risk of possible conversion to laparoscopy or open abdominal incision where indicated. Very rare risks include blood transfusion, blood clot, heart attack, pneumonia, or death.   There is also a risk of short-term postoperative urinary retention with need to use a catheter. About half of patients need to go home from surgery with a catheter, which is then later removed in the office. The risk of long-term need for a catheter is very low. There is also a risk of  worsening of overactive bladder.   Sling: The effectiveness of a midurethral vaginal mesh sling is approximately 85%, and thus, there will be times when you may leak urine after surgery, especially if your bladder is full or if you have a strong cough. There is a balance between making the sling tight enough to treat your leakage but not too tight so that you have long-term difficulty emptying your bladder. A mesh sling will not directly treat overactive bladder/urge incontinence and may worsen it.  There is an FDA safety notification on vaginal mesh procedures for prolapse but NOT mesh slings. We have extensive experience and training with mesh placement and we have close postoperative follow up to identify any potential complications from mesh. It is important to realize that this mesh is a permanent implant that cannot be easily removed. There are rare risks of mesh exposure (2-4%), pain with intercourse (0-7%), and infection (<1%). The risk of mesh exposure if more likely in a woman with risks for poor healing (prior radiation, poorly controlled diabetes, or immunocompromised) and also in smokers. The risk of new or worsened chronic pain after mesh implant is more common in women with baseline chronic pain and/or poorly controlled anxiety or depression. Approximately 2-4% of patients will experience longer-term post-operative voiding dysfunction that may require surgical revision of the sling. We also reviewed that postoperatively, her stream may not be as strong as before surgery.   Prolapse (with or without mesh): Risk factors for surgical failure  include things that put pressure on your pelvis and the surgical repair, including obesity, chronic cough, and heavy lifting or straining (including lifting children or adults, straining on the toilet, or lifting heavy objects such as  furniture or anything weighing >25 lbs. Risks of recurrence is 20-30% with vaginal native tissue repair and a less than 10%  with sacrocolpopexy with mesh.    - Medical clearance: required from Pulmonology due to emphysema. Letter sent to Dr Annella requesting risk stratification and medical optimization. - Anticoagulant use: No - Medicaid Hysterectomy form: n/a - Accepts blood transfusion: Yes - Expected length of stay: outpatient  Request sent for surgery scheduling.
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
## 2024-07-15 NOTE — Patient Instructions (Signed)

## 2024-07-15 NOTE — Assessment & Plan Note (Signed)
-   Stage 1 anterior vaginal prolapse but patient reports she is more bothered by the bulge recently.  - Can plan on anterior repair at the time of sling if needed

## 2024-07-18 ENCOUNTER — Telehealth: Payer: Self-pay | Admitting: Pulmonary Disease

## 2024-07-18 ENCOUNTER — Other Ambulatory Visit: Payer: Self-pay | Admitting: Family Medicine

## 2024-07-18 ENCOUNTER — Encounter: Payer: Self-pay | Admitting: Obstetrics and Gynecology

## 2024-07-18 NOTE — Telephone Encounter (Signed)
 Fax received from Dr. Rosaline Caper with [facility] to perform a surgery for pelvic organ prolapse. Her surgery will be vaginal and last approx 1 hourand she will most likely undergo general anesthesia on patient.  Patient needs surgery clearance. Surgery is pending. Patient was seen on 01/02/24. Office protocol is a risk assessment can be sent to surgeon if patient has been seen in 60 days or less.   She has upcoming appt with Dr. Annella for 07/28/24. Will forward to clearance pool until visit is complete.

## 2024-07-21 ENCOUNTER — Telehealth: Admitting: Physician Assistant

## 2024-07-21 ENCOUNTER — Other Ambulatory Visit: Payer: Self-pay | Admitting: Family Medicine

## 2024-07-21 DIAGNOSIS — B370 Candidal stomatitis: Secondary | ICD-10-CM | POA: Diagnosis not present

## 2024-07-21 DIAGNOSIS — J019 Acute sinusitis, unspecified: Secondary | ICD-10-CM | POA: Diagnosis not present

## 2024-07-21 DIAGNOSIS — B9689 Other specified bacterial agents as the cause of diseases classified elsewhere: Secondary | ICD-10-CM

## 2024-07-21 MED ORDER — NYSTATIN 100000 UNIT/ML MT SUSP: 5.0000 mL | Freq: Four times a day (QID) | OROMUCOSAL | 0 refills | Status: AC

## 2024-07-21 MED ORDER — FLUCONAZOLE 150 MG PO TABS: 150.0000 mg | ORAL_TABLET | ORAL | 0 refills | Status: AC | PRN

## 2024-07-21 MED ORDER — AMOXICILLIN-POT CLAVULANATE 875-125 MG PO TABS: 1.0000 | ORAL_TABLET | Freq: Two times a day (BID) | ORAL | 0 refills | Status: DC

## 2024-07-21 NOTE — Progress Notes (Signed)
 Virtual Visit Consent   [name], you are scheduled for a virtual visit with a [facility] provider today. Just as with appointments in the office, your consent must be obtained to participate. Your consent will be active for this visit and any virtual visit you may have with one of our providers in the next 365 days. If you have a MyChart account, a copy of this consent can be sent to you electronically.  As this is a virtual visit, video technology does not allow for your provider to perform a traditional examination. This may limit your provider's ability to fully assess your condition. If your provider identifies any concerns that need to be evaluated in person or the need to arrange testing (such as labs, EKG, etc.), we will make arrangements to do so. Although advances in technology are sophisticated, we cannot ensure that it will always work on either your end or our end. If the connection with a video visit is poor, the visit may have to be switched to a telephone visit. With either a video or telephone visit, we are not always able to ensure that we have a secure connection.  By engaging in this virtual visit, you consent to the provision of healthcare and authorize for your insurance to be billed (if applicable) for the services provided during this visit. Depending on your insurance coverage, you may receive a charge related to this service.  I need to obtain your verbal consent now. Are you willing to proceed with your visit today? Lacey Woodard has provided verbal consent on 07/21/2024 for a virtual visit (video or telephone). Delon CHRISTELLA Dickinson, PA-C  Date: 07/21/2024 2:05 PM   Virtual Visit via Video Note   I, Delon CHRISTELLA Dickinson, connected with  Lacey Woodard  ([id number], September 13, 1970) on 07/21/24 at  2:00 PM EST by a video-enabled telemedicine application and verified that I am speaking with the correct person using two identifiers.  Location: Patient: Virtual Visit  Location Patient: Home Provider: Virtual Visit Location Provider: Home Office   I discussed the limitations of evaluation and management by telemedicine and the availability of in person appointments. The patient expressed understanding and agreed to proceed.    History of Present Illness: Lacey Woodard is a 53 y.o. who identifies as a female who was assigned female at birth, and is being seen today for sinus congestion.  HPI: Sinusitis This is a new problem. The current episode started in the past 7 days. The problem has been gradually worsening since onset. There has been no fever (having cold inteolerance right now). Associated symptoms include chills, congestion, coughing (mild), headaches, a hoarse voice and sinus pressure. Pertinent negatives include no diaphoresis, ear pain, shortness of breath or sore throat. (Post nasal drainage) Treatments tried: allergy medications. The treatment provided no relief.     Problems:  Patient Active Problem List   Diagnosis Date Noted   SUI (stress urinary incontinence, female) 07/15/2024   Urinary frequency 07/15/2024   Prolapse of anterior vaginal wall 07/15/2024   Urinary urgency 07/15/2024   Vitamin D  deficiency 07/09/2024   TSH (thyroid-stimulating hormone deficiency) 07/09/2024   IFG (impaired fasting glucose) 07/09/2024   Xerostomia 07/09/2024   Moderate episode of recurrent major depressive disorder (HCC) 06/11/2024   GAD (generalized anxiety disorder) 06/11/2024   Postherpetic neuralgia 06/11/2024   Shingles 05/06/2024   Upper respiratory infection 02/20/2024   DOE (dyspnea on exertion) 01/09/2024   Nicotine abuse 01/09/2024   Palpitations 01/09/2024  Emphysema lung (HCC) 08/20/2023   Hyperlipidemia LDL goal <70 08/20/2023   Anxiety and depression 06/25/2023   Mixed stress and urge urinary incontinence 12/01/2022   Current use of estrogen therapy 10/11/2022   Hair thinning 09/12/2022   Moody 09/12/2022   S/P hysterectomy  with oophorectomy 09/12/2022   Dyspareunia in female 09/12/2022   Vaginal dryness 09/12/2022   Night sweats 09/12/2022    Allergies: No Known Allergies Medications:  Current Outpatient Medications:    amoxicillin -clavulanate (AUGMENTIN ) 875-125 MG tablet, Take 1 tablet by mouth 2 (two) times daily., Disp: 14 tablet, Rfl: 0   fluconazole  (DIFLUCAN ) 150 MG tablet, Take 1 tablet (150 mg total) by mouth every 3 (three) days as needed., Disp: 2 tablet, Rfl: 0   nystatin  (MYCOSTATIN ) 100000 UNIT/ML suspension, Take 5 mLs (500,000 Units total) by mouth 4 (four) times daily., Disp: 240 mL, Rfl: 0   ALPRAZolam  (XANAX ) 0.5 MG tablet, Take 1 tablet (0.5 mg total) by mouth at bedtime., Disp: 30 tablet, Rfl: 1   Cholecalciferol (VITAMIN D3) 25 MCG (1000 UT) CAPS, Take 1,000 Units by mouth daily., Disp: , Rfl:    escitalopram  (LEXAPRO ) 20 MG tablet, Take 1 tablet by mouth once daily, Disp: 30 tablet, Rfl: 0   estradiol  (ESTRACE ) 2 MG tablet, Take 1 tablet (2 mg total) by mouth daily., Disp: 30 tablet, Rfl: 6   gabapentin  (NEURONTIN ) 300 MG capsule, TAKE 1 CAPSULE BY MOUTH TWICE DAILY AS NEEDED, Disp: 90 capsule, Rfl: 0   magnesium oxide (MAG-OX) 400 (240 Mg) MG tablet, Take 400 mg by mouth at bedtime., Disp: , Rfl:    ondansetron  (ZOFRAN -ODT) 4 MG disintegrating tablet, Take 4 mg by mouth every 8 (eight) hours as needed., Disp: , Rfl:    pilocarpine  (SALAGEN ) 5 MG tablet, Take 1 tablet (5 mg total) by mouth 3 (three) times daily., Disp: 90 tablet, Rfl: 5   Tiotropium Bromide-Olodaterol (STIOLTO RESPIMAT ) 2.5-2.5 MCG/ACT AERS, Inhale 2 puffs into the lungs daily., Disp: 1 each, Rfl: 11  Observations/Objective: Patient is well-developed, well-nourished in no acute distress.  Resting comfortably at home.  Head is normocephalic, atraumatic.  No labored breathing.  Speech is clear and coherent with logical content.  Patient is alert and oriented at baseline.    Assessment and Plan: 1. Acute bacterial  sinusitis (Primary) - amoxicillin -clavulanate (AUGMENTIN ) 875-125 MG tablet; Take 1 tablet by mouth 2 (two) times daily.  Dispense: 14 tablet; Refill: 0  2. Thrush - fluconazole  (DIFLUCAN ) 150 MG tablet; Take 1 tablet (150 mg total) by mouth every 3 (three) days as needed.  Dispense: 2 tablet; Refill: 0 - nystatin  (MYCOSTATIN ) 100000 UNIT/ML suspension; Take 5 mLs (500,000 Units total) by mouth 4 (four) times daily.  Dispense: 240 mL; Refill: 0  - Worsening symptoms that have not responded to OTC medications.  - Will give Augmentin  - Continue allergy medications.  - Steam and humidifier can help - Stay well hydrated and get plenty of rest.  - Diflucan  and Nystatin  suspension given as prophylaxis as patient tends to get yeast oral infections with antibiotic use - Seek in person evaluation if no symptom improvement or if symptoms worsen   Follow Up Instructions: I discussed the assessment and treatment plan with the patient. The patient was provided an opportunity to ask questions and all were answered. The patient agreed with the plan and demonstrated an understanding of the instructions.  A copy of instructions were sent to the patient via MyChart unless otherwise noted below.    The  patient was advised to call back or seek an in-person evaluation if the symptoms worsen or if the condition fails to improve as anticipated.    Delon CHRISTELLA Dickinson, PA-C

## 2024-07-21 NOTE — Patient Instructions (Signed)
 Lacey Woodard, thank you for joining Delon CHRISTELLA Dickinson, PA-C for today's virtual visit.  While this provider is not your primary care provider (PCP), if your PCP is located in our provider database this encounter information will be shared with them immediately following your visit.   A [facility] MyChart account gives you access to today's visit and all your visits, tests, and labs performed at [facility]  click here if you don't have a [facility] MyChart account or go to [email or web address]  Consent: (Patient) Lacey Woodard provided verbal consent for this virtual visit at the beginning of the encounter.  Current Medications:  Current Outpatient Medications:    amoxicillin -clavulanate (AUGMENTIN ) 875-125 MG tablet, Take 1 tablet by mouth 2 (two) times daily., Disp: 14 tablet, Rfl: 0   fluconazole  (DIFLUCAN ) 150 MG tablet, Take 1 tablet (150 mg total) by mouth every 3 (three) days as needed., Disp: 2 tablet, Rfl: 0   nystatin  (MYCOSTATIN ) 100000 UNIT/ML suspension, Take 5 mLs (500,000 Units total) by mouth 4 (four) times daily., Disp: 240 mL, Rfl: 0   ALPRAZolam  (XANAX ) 0.5 MG tablet, Take 1 tablet (0.5 mg total) by mouth at bedtime., Disp: 30 tablet, Rfl: 1   Cholecalciferol (VITAMIN D3) 25 MCG (1000 UT) CAPS, Take 1,000 Units by mouth daily., Disp: , Rfl:    escitalopram  (LEXAPRO ) 20 MG tablet, Take 1 tablet by mouth once daily, Disp: 30 tablet, Rfl: 0   estradiol  (ESTRACE ) 2 MG tablet, Take 1 tablet (2 mg total) by mouth daily., Disp: 30 tablet, Rfl: 6   gabapentin  (NEURONTIN ) 300 MG capsule, TAKE 1 CAPSULE BY MOUTH TWICE DAILY AS NEEDED, Disp: 90 capsule, Rfl: 0   magnesium oxide (MAG-OX) 400 (240 Mg) MG tablet, Take 400 mg by mouth at bedtime., Disp: , Rfl:    ondansetron  (ZOFRAN -ODT) 4 MG disintegrating tablet, Take 4 mg by mouth every 8 (eight) hours as needed., Disp: , Rfl:    pilocarpine  (SALAGEN ) 5 MG tablet, Take 1 tablet (5 mg total) by mouth 3  (three) times daily., Disp: 90 tablet, Rfl: 5   Tiotropium Bromide-Olodaterol (STIOLTO RESPIMAT ) 2.5-2.5 MCG/ACT AERS, Inhale 2 puffs into the lungs daily., Disp: 1 each, Rfl: 11   Medications ordered in this encounter:  Meds ordered this encounter  Medications   amoxicillin -clavulanate (AUGMENTIN ) 875-125 MG tablet    Sig: Take 1 tablet by mouth 2 (two) times daily.    Dispense:  14 tablet    Refill:  0    Supervising Provider:   LAMPTEY, PHILIP O [[id number]]   fluconazole  (DIFLUCAN ) 150 MG tablet    Sig: Take 1 tablet (150 mg total) by mouth every 3 (three) days as needed.    Dispense:  2 tablet    Refill:  0    Supervising Provider:   LAMPTEY, PHILIP O [[id number]]   nystatin  (MYCOSTATIN ) 100000 UNIT/ML suspension    Sig: Take 5 mLs (500,000 Units total) by mouth 4 (four) times daily.    Dispense:  240 mL    Refill:  0    Supervising Provider:   BLAISE ALEENE KIDD [[id number]]     *If you need refills on other medications prior to your next appointment, please contact your pharmacy*  Follow-Up: Call back or seek an in-person evaluation if the symptoms worsen or if the condition fails to improve as anticipated.  [facility] [phone]  Other Instructions Sinus Infection, Adult A sinus infection, also called sinusitis, is inflammation of your sinuses. Sinuses are  hollow spaces in the bones around your face. Your sinuses are located: Around your eyes. In the middle of your forehead. Behind your nose. In your cheekbones. Mucus normally drains out of your sinuses. When your nasal tissues become inflamed or swollen, mucus can become trapped or blocked. This allows bacteria, viruses, and fungi to grow, which leads to infection. Most infections of the sinuses are caused by a virus. A sinus infection can develop quickly. It can last for up to 4 weeks (acute) or for more than 12 weeks (chronic). A sinus infection often develops after a cold. What are the causes? This  condition is caused by anything that creates swelling in the sinuses or stops mucus from draining. This includes: Allergies. Asthma. Infection from bacteria or viruses. Deformities or blockages in your nose or sinuses. Abnormal growths in the nose (nasal polyps). Pollutants, such as chemicals or irritants in the air. Infection from fungi. This is rare. What increases the risk? You are more likely to develop this condition if you: Have a weak body defense system (immune system). Do a lot of swimming or diving. Overuse nasal sprays. Smoke. What are the signs or symptoms? The main symptoms of this condition are pain and a feeling of pressure around the affected sinuses. Other symptoms include: Stuffy nose or congestion that makes it difficult to breathe through your nose. Thick yellow or greenish drainage from your nose. Tenderness, swelling, and warmth over the affected sinuses. A cough that may get worse at night. Decreased sense of smell and taste. Extra mucus that collects in the throat or the back of the nose (postnasal drip) causing a sore throat or bad breath. Tiredness (fatigue). Fever. How is this diagnosed? This condition is diagnosed based on: Your symptoms. Your medical history. A physical exam. Tests to find out if your condition is acute or chronic. This may include: Checking your nose for nasal polyps. Viewing your sinuses using a device that has a light (endoscope). Testing for allergies or bacteria. Imaging tests, such as an MRI or CT scan. In rare cases, a bone biopsy may be done to rule out more serious types of fungal sinus disease. How is this treated? Treatment for a sinus infection depends on the cause and whether your condition is chronic or acute. If caused by a virus, your symptoms should go away on their own within 10 days. You may be given medicines to relieve symptoms. They include: Medicines that shrink swollen nasal passages (decongestants). A spray  that eases inflammation of the nostrils (topical intranasal corticosteroids). Rinses that help get rid of thick mucus in your nose (nasal saline washes). Medicines that treat allergies (antihistamines). Over-the-counter pain relievers. If caused by bacteria, your health care provider may recommend waiting to see if your symptoms improve. Most bacterial infections will get better without antibiotic medicine. You may be given antibiotics if you have: A severe infection. A weak immune system. If caused by narrow nasal passages or nasal polyps, surgery may be needed. Follow these instructions at home: Medicines Take, use, or apply over-the-counter and prescription medicines only as told by your health care provider. These may include nasal sprays. If you were prescribed an antibiotic medicine, take it as told by your health care provider. Do not stop taking the antibiotic even if you start to feel better. Hydrate and humidify  Drink enough fluid to keep your urine pale yellow. Staying hydrated will help to thin your mucus. Use a cool mist humidifier to keep the humidity level in  your home above 50%. Inhale steam for 10-15 minutes, 3-4 times a day, or as told by your health care provider. You can do this in the bathroom while a hot shower is running. Limit your exposure to cool or dry air. Rest Rest as much as possible. Sleep with your head raised (elevated). Make sure you get enough sleep each night. General instructions  Apply a warm, moist washcloth to your face 3-4 times a day or as told by your health care provider. This will help with discomfort. Use nasal saline washes as often as told by your health care provider. Wash your hands often with soap and water to reduce your exposure to germs. If soap and water are not available, use hand sanitizer. Do not smoke. Avoid being around people who are smoking (secondhand smoke). Keep all follow-up visits. This is important. Contact a health  care provider if: You have a fever. Your symptoms get worse. Your symptoms do not improve within 10 days. Get help right away if: You have a severe headache. You have persistent vomiting. You have severe pain or swelling around your face or eyes. You have vision problems. You develop confusion. Your neck is stiff. You have trouble breathing. These symptoms may be an emergency. Get help right away. Call 911. Do not wait to see if the symptoms will go away. Do not drive yourself to the hospital. Summary A sinus infection is soreness and inflammation of your sinuses. Sinuses are hollow spaces in the bones around your face. This condition is caused by nasal tissues that become inflamed or swollen. The swelling traps or blocks the flow of mucus. This allows bacteria, viruses, and fungi to grow, which leads to infection. If you were prescribed an antibiotic medicine, take it as told by your health care provider. Do not stop taking the antibiotic even if you start to feel better. Keep all follow-up visits. This is important. This information is not intended to replace advice given to you by your health care provider. Make sure you discuss any questions you have with your health care provider. Document Revised: 07/19/2021 Document Reviewed: 07/19/2021 Elsevier Patient Education  2024 [employer]   If you have been instructed to have an in-person evaluation today at a local Urgent Care facility, please use the link below. It will take you to a list of all of our available [facility], including address, phone number and hours of operation. Please do not delay care.  [facility]  If you or a family member do not have a primary care provider, use the link below to schedule a visit and establish care. When you choose a [facility] primary care physician or advanced practice provider, you gain a long-term partner in health. Find a Primary Care Provider  Learn more about  [facility] in-office and virtual care options: [facility] Get Care Now

## 2024-07-23 ENCOUNTER — Ambulatory Visit: Admitting: Obstetrics and Gynecology

## 2024-07-23 ENCOUNTER — Encounter

## 2024-07-23 ENCOUNTER — Ambulatory Visit: Admitting: Pulmonary Disease

## 2024-07-28 ENCOUNTER — Ambulatory Visit (HOSPITAL_BASED_OUTPATIENT_CLINIC_OR_DEPARTMENT_OTHER)

## 2024-07-28 ENCOUNTER — Encounter: Payer: Self-pay | Admitting: Pulmonary Disease

## 2024-07-28 ENCOUNTER — Ambulatory Visit: Admitting: Pulmonary Disease

## 2024-07-28 VITALS — BP 127/83 | HR 87 | Temp 98.3°F | Ht 67.0 in | Wt 151.8 lb

## 2024-07-28 DIAGNOSIS — J4489 Other specified chronic obstructive pulmonary disease: Secondary | ICD-10-CM | POA: Diagnosis not present

## 2024-07-28 DIAGNOSIS — Z01811 Encounter for preprocedural respiratory examination: Secondary | ICD-10-CM

## 2024-07-28 DIAGNOSIS — R0609 Other forms of dyspnea: Secondary | ICD-10-CM

## 2024-07-28 LAB — PULMONARY FUNCTION TEST
DL/VA % pred: 69 %
DL/VA: 2.96 ml/min/mmHg/L
DLCO unc % pred: 55 %
DLCO unc: 12.02 ml/min/mmHg
FEF 25-75 Post: 1.12 L/s
FEF 25-75 Pre: 0.68 L/s
FEF2575-%Change-Post: 63 %
FEF2575-%Pred-Post: 41 %
FEF2575-%Pred-Pre: 25 %
FEV1-%Change-Post: 17 %
FEV1-%Pred-Post: 61 %
FEV1-%Pred-Pre: 52 %
FEV1-Post: 1.74 L
FEV1-Pre: 1.49 L
FEV1FVC-%Change-Post: 3 %
FEV1FVC-%Pred-Pre: 73 %
FEV6-%Change-Post: 12 %
FEV6-%Pred-Post: 80 %
FEV6-%Pred-Pre: 71 %
FEV6-Post: 2.81 L
FEV6-Pre: 2.5 L
FEV6FVC-%Change-Post: 0 %
FEV6FVC-%Pred-Post: 100 %
FEV6FVC-%Pred-Pre: 100 %
FVC-%Change-Post: 13 %
FVC-%Pred-Post: 80 %
FVC-%Pred-Pre: 70 %
FVC-Post: 2.89 L
FVC-Pre: 2.55 L
Post FEV1/FVC ratio: 60 %
Post FEV6/FVC ratio: 97 %
Pre FEV1/FVC ratio: 58 %
Pre FEV6/FVC Ratio: 98 %
RV % pred: 240 %
RV: 4.56 L
TLC % pred: 138 %
TLC: 7.23 L

## 2024-07-28 MED ORDER — FLUTICASONE PROPIONATE HFA 220 MCG/ACT IN AERO: 2.0000 | INHALATION_SPRAY | Freq: Two times a day (BID) | RESPIRATORY_TRACT | 12 refills | Status: AC

## 2024-07-28 NOTE — Progress Notes (Signed)
 Full PFT performed today.

## 2024-07-28 NOTE — Patient Instructions (Signed)
 Full PFT performed today.

## 2024-07-28 NOTE — Progress Notes (Signed)
 @Patient  ID: Lacey Woodard, female    DOB: May 12, 1971, 53 y.o.   MRN: [id number]  Chief Complaint  Patient presents with   Shortness of Breath    Needs risk assessment.  Having bladder sling.  No breathing concerns.    Referring provider: Annella Donnice SAUNDERS, MD  HPI:   53 y.o. woman whom are seen for evaluation of dyspnea on exertion with PFTs 07/2024 consistent with COPD and asthma overlap with significant bronchodilator response.  Most recent PCP note reviewed.  Returns for routine follow-up.  Incidentally, has upcoming surgery, needs preoperative evaluation.  Rating stable.  Largely unchanged.  Using Stiolto.  No real improvement.  No exacerbations in the interim.  PFT performed today, interpret below, consider COPD and asthma.  We discussed in detail.  Discussed escalating treatment regimen.  HPI initial visit: She reports onset of shortness of breath for the last 5 to 6 months.  With exertion.  At rest she is fine.  Triggered with stairs or inclines.  Or carrying things.  No time of day when things are better or worse.  No position to make these better worse.  No seasonal or environmental factors she can identify to make things better or worse.  No alleviating or exacerbating factors other than rest.  She smokes 20 to 30 cigarettes a day gestational days.  She would like to cut back but is not sure if she is really ready right now.  Several stressors.  In addition to the dyspnea she reports onset of what sound like palpitations.  She feels like her heart is fluttering.  Her heartbeat is fast.  Also this occurs at rest.  Less frequently with exertion.  She is taking a Xanax  and this does not help the sensation.  She had a low-dose CT scan for lung cancer screening 07/2023 on my read interpretation reveals significant, severe emphysematous changes with a large bleb at the right apex versus possible chronic loculated pneumothorax.  Questionaires / Pulmonary Flowsheets:   ACT:       No data to display          MMRC:     No data to display          Epworth:      No data to display          Tests:   FENO:  No results found for: NITRICOXIDE  PFT:    Latest Ref Rng & Units 07/28/2024    8:08 AM  PFT Results  FVC-Pre L 2.55  P  FVC-Predicted Pre % 70  P  FVC-Post L 2.89  P  FVC-Predicted Post % 80  P  Pre FEV1/FVC % % 58  P  Post FEV1/FCV % % 60  P  FEV1-Pre L 1.49  P  FEV1-Predicted Pre % 52  P  FEV1-Post L 1.74  P  DLCO uncorrected ml/min/mmHg 12.02  P  DLCO UNC% % 55  P  DLVA Predicted % 69  P  TLC L 7.23  P  TLC % Predicted % 138  P  RV % Predicted % 240  P    P Preliminary result  Personally reviewed and interpreted as moderate fixed obstruction, spirometry suggestive of air trapping versus restriction.  Significant bronchodilator response in both FEV1 and FVC.  Lung volumes consistent with air trapping and hyperinflation.  DLCO is moderately reduced.  WALK:      No data to display          Imaging:  Personally reviewed and as per EMR and discussion in this note No results found.  Lab Results: Personally reviewed CBC    Component Value Date/Time   WBC 7.7 07/04/2023 1004   WBC 9.5 08/17/2013 2330   RBC 4.71 07/04/2023 1004   RBC 4.41 08/17/2013 2330   HGB 15.1 07/04/2023 1004   HCT 45.4 07/04/2023 1004   PLT 274 07/04/2023 1004   MCV 96 07/04/2023 1004   MCH 32.1 07/04/2023 1004   MCH 31.7 08/17/2013 2330   MCHC 33.3 07/04/2023 1004   MCHC 33.9 08/17/2013 2330   RDW 12.4 07/04/2023 1004   LYMPHSABS 2.9 07/04/2023 1004   MONOABS 0.6 08/17/2013 2330   EOSABS 0.2 07/04/2023 1004   BASOSABS 0.0 07/04/2023 1004    BMET    Component Value Date/Time   NA 143 07/04/2023 1004   K 4.4 07/04/2023 1004   CL 105 07/04/2023 1004   CO2 22 07/04/2023 1004   GLUCOSE 88 07/04/2023 1004   GLUCOSE 92 08/17/2013 2330   BUN 9 07/04/2023 1004   CREATININE 1.04 (H) 07/04/2023 1004   CALCIUM  9.4 07/04/2023 1004   GFRNONAA  >90 08/17/2013 2330   GFRAA >90 08/17/2013 2330    BNP No results found for: BNP  ProBNP No results found for: PROBNP  Specialty Problems       Pulmonary Problems   Emphysema lung (HCC)   DOE (dyspnea on exertion)   Upper respiratory infection    No Known Allergies   There is no immunization history on file for this patient.  Past Medical History:  Diagnosis Date   Acid reflux    Anxiety    Depression    Emphysema lung (HCC)    Hyperlipidemia     Tobacco History: Social History   Tobacco Use  Smoking Status Every Day   Current packs/day: 1.00   Average packs/day: 1 pack/day for 35.9 years (35.9 ttl pk-yrs)   Types: Cigarettes   Start date: 1990  Smokeless Tobacco Never  Tobacco Comments   Smoking 1 ppd.  Trying to slow down.  Wants to quit after her surger. 07/28/2024 hfb RN   Ready to quit: Not Answered Counseling given: Not Answered Tobacco comments: Smoking 1 ppd.  Trying to slow down.  Wants to quit after her surger. 07/28/2024 hfb RN   Continue to not smoke  Outpatient Encounter Medications as of 07/28/2024  Medication Sig   ALPRAZolam  (XANAX ) 0.5 MG tablet Take 1 tablet (0.5 mg total) by mouth at bedtime.   amoxicillin -clavulanate (AUGMENTIN ) 875-125 MG tablet Take 1 tablet by mouth 2 (two) times daily.   Cholecalciferol (VITAMIN D3) 25 MCG (1000 UT) CAPS Take 1,000 Units by mouth daily.   escitalopram  (LEXAPRO ) 20 MG tablet Take 1 tablet by mouth once daily   estradiol  (ESTRACE ) 2 MG tablet Take 1 tablet (2 mg total) by mouth daily.   fluconazole  (DIFLUCAN ) 150 MG tablet Take 1 tablet (150 mg total) by mouth every 3 (three) days as needed.   fluticasone (FLOVENT HFA) 220 MCG/ACT inhaler Inhale 2 puffs into the lungs 2 (two) times daily.   gabapentin  (NEURONTIN ) 300 MG capsule TAKE 1 CAPSULE BY MOUTH TWICE DAILY AS NEEDED   magnesium oxide (MAG-OX) 400 (240 Mg) MG tablet Take 400 mg by mouth at bedtime.   nystatin  (MYCOSTATIN ) 100000 UNIT/ML  suspension Take 5 mLs (500,000 Units total) by mouth 4 (four) times daily.   ondansetron  (ZOFRAN -ODT) 4 MG disintegrating tablet Take 4 mg by mouth every 8 (eight)  hours as needed.   pilocarpine  (SALAGEN ) 5 MG tablet Take 1 tablet (5 mg total) by mouth 3 (three) times daily.   Tiotropium Bromide-Olodaterol (STIOLTO RESPIMAT ) 2.5-2.5 MCG/ACT AERS Inhale 2 puffs into the lungs daily.   No facility-administered encounter medications on file as of 07/28/2024.     Review of Systems  Review of Systems  N/a Physical Exam  BP 127/83 (BP Location: Left Arm, Patient Position: Sitting)   Pulse 87   Temp 98.3 F (36.8 C) (Oral)   Ht 5' 7 (1.702 m)   Wt 151 lb 12.8 oz (68.9 kg)   SpO2 94% Comment: RA  BMI 23.78 kg/m   Wt Readings from Last 5 Encounters:  07/28/24 151 lb 12.8 oz (68.9 kg)  07/15/24 153 lb (69.4 kg)  07/03/24 154 lb (69.9 kg)  06/11/24 154 lb (69.9 kg)  05/09/24 151 lb 6.4 oz (68.7 kg)    BMI Readings from Last 5 Encounters:  07/28/24 23.78 kg/m  07/15/24 24.88 kg/m  07/03/24 26.43 kg/m  06/11/24 26.43 kg/m  05/09/24 24.44 kg/m     Physical Exam General: Well-appearing, in chair Neck: No JVP Pulmonary: Clear, a bit distant, normal work of breathing CV: Warm Abdomen: Nondistended   Assessment & Plan:   Dyspnea on exertion: Suspect largely driven by emphysema seen on CT scan 07/2023.  She also has palpitations, cardiac etiologies are possible.  She denies any chest pain etc. Stiolto with minimal improvement.  Escalate inhaler therapy as below.  COPD/asthma overlap: Moderate based on PFTs 07/2024.  Significant bronchodilator response in both FEV1 and FVC.  Continue Stiolto.  Add Flovent  for triple inhaled therapy.  Preoperative evaluation: Pulmonary medicine not provide preoperative coronaries but rather a preoperative risk assessment.  Based on the ARISCAT model, patient is lower 1.6% risk of postoperative pulmonary complication assuming duration of  surgery is less than 2 hours.  For duration of surgery 2 hours or greater, patient is intermediate at 13.3% risk of postop pulmonary complication.  There are no modifiable risk factors to address prior to surgery.  Recommend administration of DuoNebs in the preoperative area and postoperatively.   Return in about 3 months (around 10/26/2024) for f/u Dr. Annella.   Donnice JONELLE Annella, MD 07/28/2024

## 2024-07-29 ENCOUNTER — Encounter: Admitting: Nutrition

## 2024-07-29 NOTE — Telephone Encounter (Signed)
 OV noted dated 07/28/24 was faxed to [facility]

## 2024-08-04 ENCOUNTER — Ambulatory Visit (HOSPITAL_COMMUNITY)

## 2024-08-06 ENCOUNTER — Ambulatory Visit (HOSPITAL_COMMUNITY)
Admission: RE | Admit: 2024-08-06 | Discharge: 2024-08-06 | Disposition: A | Discharge status: Home / Self Care | Source: Ambulatory Visit | Attending: Acute Care | Admitting: Acute Care

## 2024-08-06 DIAGNOSIS — I2510 Atherosclerotic heart disease of native coronary artery without angina pectoris: Secondary | ICD-10-CM | POA: Diagnosis not present

## 2024-08-06 DIAGNOSIS — Z122 Encounter for screening for malignant neoplasm of respiratory organs: Secondary | ICD-10-CM | POA: Diagnosis present

## 2024-08-06 DIAGNOSIS — Z87891 Personal history of nicotine dependence: Secondary | ICD-10-CM | POA: Diagnosis present

## 2024-08-06 DIAGNOSIS — F17210 Nicotine dependence, cigarettes, uncomplicated: Secondary | ICD-10-CM | POA: Diagnosis present

## 2024-08-06 DIAGNOSIS — I700 Atherosclerosis of aorta: Secondary | ICD-10-CM | POA: Insufficient documentation

## 2024-08-06 DIAGNOSIS — J439 Emphysema, unspecified: Secondary | ICD-10-CM | POA: Insufficient documentation

## 2024-08-11 ENCOUNTER — Encounter (HOSPITAL_COMMUNITY): Payer: Self-pay | Admitting: Obstetrics and Gynecology

## 2024-08-11 NOTE — Progress Notes (Signed)
 Spoke w/ via phone for pre-op interview--- [facility] needs dos----   NONE      Lab results------Current EKG in Epic dated 01/09/24. COVID test -----patient states asymptomatic no test needed Arrive at -------1000 NPO after MN NO Solid Food.  Clear liquids from MN until---0900 Pre-Surgery Ensure or G2:  Med rec completed Medications to take morning of surgery -----Inhalers Diabetic medication -----  GLP1 agonist last dose: GLP1 instructions:  Patient instructed no nail polish to be worn day of surgery Patient instructed to bring photo id and insurance card day of surgery Patient aware to have Driver (ride ) / caregiver    for 24 hours after surgery - Mother Consuelo Mace Patient Special Instructions ----- Pre-Op special Instructions -----  Patient verbalized understanding of instructions that were given at this phone interview. Patient denies chest pain, sob, fever, cough at the interview.

## 2024-08-12 ENCOUNTER — Other Ambulatory Visit: Payer: Self-pay

## 2024-08-12 ENCOUNTER — Telehealth: Payer: Self-pay

## 2024-08-12 DIAGNOSIS — Z122 Encounter for screening for malignant neoplasm of respiratory organs: Secondary | ICD-10-CM

## 2024-08-12 DIAGNOSIS — F17210 Nicotine dependence, cigarettes, uncomplicated: Secondary | ICD-10-CM

## 2024-08-12 DIAGNOSIS — Z87891 Personal history of nicotine dependence: Secondary | ICD-10-CM

## 2024-08-12 NOTE — Telephone Encounter (Signed)
 Spoke with Diann and Radiology. Requested Rad scoring be added to report.

## 2024-08-14 ENCOUNTER — Ambulatory Visit: Admitting: Obstetrics and Gynecology

## 2024-08-14 ENCOUNTER — Other Ambulatory Visit: Payer: Self-pay | Admitting: Obstetrics and Gynecology

## 2024-08-14 VITALS — BP 120/78 | HR 98 | Ht 67.0 in | Wt 149.0 lb

## 2024-08-14 DIAGNOSIS — Z01818 Encounter for other preprocedural examination: Secondary | ICD-10-CM

## 2024-08-14 MED ORDER — OXYCODONE HCL 5 MG PO TABS: 5.0000 mg | ORAL_TABLET | ORAL | 0 refills | Status: DC | PRN

## 2024-08-14 MED ORDER — IBUPROFEN 600 MG PO TABS: 600.0000 mg | ORAL_TABLET | Freq: Four times a day (QID) | ORAL | 0 refills | Status: AC | PRN

## 2024-08-14 MED ORDER — ACETAMINOPHEN 500 MG PO TABS: 500.0000 mg | ORAL_TABLET | Freq: Four times a day (QID) | ORAL | 0 refills | Status: DC | PRN

## 2024-08-14 MED ORDER — ONDANSETRON HCL 4 MG PO TABS: 4.0000 mg | ORAL_TABLET | Freq: Three times a day (TID) | ORAL | 0 refills | Status: DC | PRN

## 2024-08-14 MED ORDER — POLYETHYLENE GLYCOL 3350 17 GM/SCOOP PO POWD: 17.0000 g | Freq: Every day | ORAL | 0 refills | Status: DC

## 2024-08-14 NOTE — Progress Notes (Signed)
 Lacey Woodard Pre-Operative Exam  Subjective Chief Complaint: Lacey Woodard presents for a preoperative encounter.   History of Present Illness: Lacey Woodard is a 53 y.o. female who presents for preoperative visit.  She is scheduled to undergo Plan for surgery: Exam under anesthesia, midurethral sling, cystoscopy, possible anterior repair  on 08/19/24.  Her symptoms include Stress urinary incontinence and anterior vaginal prolapse, and she was was found to have Stage I anterior, Stage I posterior, Stage 0 apical prolapse.   Urodynamics showed: Deferred, CST+   Past Medical History:  Diagnosis Date   Acid reflux    Anxiety    Depression    Emphysema lung (HCC)    Hyperlipidemia    PONV (postoperative nausea and vomiting)      Past Surgical History:  Procedure Laterality Date   bilateral inguinal hernia repair     COLONOSCOPY WITH PROPOFOL  N/A 08/08/2022   Procedure: COLONOSCOPY WITH PROPOFOL ;  Surgeon: Cindie Carlin POUR, DO;  Location: [facility];  Service: Endoscopy;  Laterality: N/A;  1:00pm, asa 2   VAGINAL HYSTERECTOMY      has no known allergies.   Family History  Problem Relation Age of Onset   Colon cancer Maternal Aunt    Bladder Cancer Neg Hx    Renal cancer Neg Hx    Uterine cancer Neg Hx     Social History[1]   Review of Systems was negative for a full 10 system review except as noted in the History of Present Illness.  Current Medications[2]   Objective Vitals:   08/14/24 0832  BP: 120/78  Pulse: 98    Gen: NAD CV: S1 S2 RRR Lungs: Clear to auscultation bilaterally Abd: soft, nontender   Previous Pelvic Exam showed: POP-Q   -2                                            Aa   -2                                           Ba   -8                                              C    4                                            Gh   4                                            Pb   9                                             tvl    -2.5  Ap   -2.5                                            Bp                                                        Assessment/ Plan  Assessment: The patient is a 53 y.o. year old scheduled to undergo Exam under anesthesia, midurethral sling, cystoscopy, possible anterior repair . Verbal consent was obtained for these procedures.  Plan: General Surgical Consent: The patient has previously been counseled on alternative treatments, and the decision by the patient and provider was to proceed with the procedure listed above.  For all procedures, there are risks of bleeding, infection, damage to surrounding organs including but not limited to bowel, bladder, blood vessels, ureters and nerves, and need for further surgery if an injury were to occur. These risks are all low with minimally invasive surgery.   There are risks of numbness and weakness at any body site or buttock/rectal pain.  It is possible that baseline pain can be worsened by surgery, either with or without mesh. If surgery is vaginal, there is also a low risk of possible conversion to laparoscopy or open abdominal incision where indicated. Very rare risks include blood transfusion, blood clot, heart attack, pneumonia, or death.   There is also a risk of short-term postoperative urinary retention with need to use a catheter. About half of patients need to go home from surgery with a catheter, which is then later removed in the office. The risk of long-term need for a catheter is very low. There is also a risk of worsening of overactive bladder.   Sling: The effectiveness of a midurethral vaginal mesh sling is approximately 85%, and thus, there will be times when you may leak urine after surgery, especially if your bladder is full or if you have a strong cough. There is a balance between making the sling tight enough to treat your leakage but not too tight so that you have  long-term difficulty emptying your bladder. A mesh sling will not directly treat overactive bladder/urge incontinence and may worsen it.  There is an FDA safety notification on vaginal mesh procedures for prolapse but NOT mesh slings. We have extensive experience and training with mesh placement and we have close postoperative follow up to identify any potential complications from mesh. It is important to realize that this mesh is a permanent implant that cannot be easily removed. There are rare risks of mesh exposure (2-4%), pain with intercourse (0-7%), and infection (<1%). The risk of mesh exposure if more likely in a woman with risks for poor healing (prior radiation, poorly controlled diabetes, or immunocompromised). The risk of new or worsened chronic pain after mesh implant is more common in women with baseline chronic pain and/or poorly controlled anxiety or depression. Approximately 2-4% of patients will experience longer-term post-operative voiding dysfunction that may require surgical revision of the sling. We also reviewed that postoperatively, her stream may not be as strong as before surgery.    Prolapse (with or without mesh): Risk factors for surgical failure  include things that put pressure on your pelvis and the surgical repair,  including obesity, chronic cough, and heavy lifting or straining (including lifting children or adults, straining on the toilet, or lifting heavy objects such as furniture or anything weighing >25 lbs. Risks of recurrence is 20-30% with vaginal native tissue repair and a less than 10% with sacrocolpopexy with mesh.    We discussed consent for blood products. Risks for blood transfusion include allergic reactions, other reactions that can affect different body organs and managed accordingly, transmission of infectious diseases such as HIV or Hepatitis. However, the blood is screened. Patient consents for blood products.  Pre-operative instructions:  She was  instructed to not take Aspirin /NSAIDs x 7days prior to surgery.  Antibiotic prophylaxis was ordered as indicated.  Catheter use: Patient will go home with foley if needed after post-operative voiding trial.  Post-operative instructions:  She was provided with specific post-operative instructions, including precautions and signs/symptoms for which we would recommend contacting us , in addition to daytime and after-hours contact phone numbers. This was provided on a handout.   Post-operative medications: Prescriptions for motrin , tylenol , miralax , zofran , and oxycodone  were sent to her pharmacy. Discussed using ibuprofen  and tylenol  on a schedule to limit use of narcotics.   Laboratory testing:  We will check labs: As requested by anesthesia  Preoperative clearance:  She does require surgical clearance. Dr. Annella requests Duonebs prior to and after surgery.    Post-operative follow-up:  A post-operative appointment will be made for 6 weeks from the date of surgery. If she needs a post-operative nurse visit for a voiding trial, that will be set up after she leaves the hospital.    Patient will call the clinic or use MyChart should anything change or any new issues arise.   Kolby Myung G Darious Rehman, NP       [1]  Social History Tobacco Use   Smoking status: Every Day    Current packs/day: 1.00    Average packs/day: 1 pack/day for 36.0 years (36.0 ttl pk-yrs)    Types: Cigarettes    Start date: 74   Smokeless tobacco: Never   Tobacco comments:    Smoking 1 ppd.  Trying to slow down.  Wants to quit after her surger. 07/28/2024 hfb RN  Vaping Use   Vaping status: Never Used  Substance Use Topics   Alcohol use: No   Drug use: No  [2]  Current Outpatient Medications:    acetaminophen  (TYLENOL ) 500 MG tablet, Take 1 tablet (500 mg total) by mouth every 6 (six) hours as needed (pain)., Disp: 30 tablet, Rfl: 0   ALPRAZolam  (XANAX ) 0.5 MG tablet, Take 1 tablet (0.5 mg total) by mouth at  bedtime., Disp: 30 tablet, Rfl: 1   amoxicillin -clavulanate (AUGMENTIN ) 875-125 MG tablet, Take 1 tablet by mouth 2 (two) times daily., Disp: 14 tablet, Rfl: 0   Cholecalciferol (VITAMIN D3) 25 MCG (1000 UT) CAPS, Take 1,000 Units by mouth daily., Disp: , Rfl:    escitalopram  (LEXAPRO ) 20 MG tablet, Take 1 tablet by mouth once daily, Disp: 30 tablet, Rfl: 0   estradiol  (ESTRACE ) 2 MG tablet, Take 1 tablet (2 mg total) by mouth daily., Disp: 30 tablet, Rfl: 6   fluconazole  (DIFLUCAN ) 150 MG tablet, Take 1 tablet (150 mg total) by mouth every 3 (three) days as needed., Disp: 2 tablet, Rfl: 0   fluticasone  (FLOVENT  HFA) 220 MCG/ACT inhaler, Inhale 2 puffs into the lungs 2 (two) times daily., Disp: 1 each, Rfl: 12   gabapentin  (NEURONTIN ) 300 MG capsule, TAKE 1 CAPSULE BY MOUTH TWICE DAILY  AS NEEDED, Disp: 90 capsule, Rfl: 0   ibuprofen  (ADVIL ) 600 MG tablet, Take 1 tablet (600 mg total) by mouth every 6 (six) hours as needed., Disp: 30 tablet, Rfl: 0   magnesium oxide (MAG-OX) 400 (240 Mg) MG tablet, Take 400 mg by mouth at bedtime., Disp: , Rfl:    nystatin  (MYCOSTATIN ) 100000 UNIT/ML suspension, Take 5 mLs (500,000 Units total) by mouth 4 (four) times daily., Disp: 240 mL, Rfl: 0   ondansetron  (ZOFRAN ) 4 MG tablet, Take 1 tablet (4 mg total) by mouth every 8 (eight) hours as needed for nausea or vomiting., Disp: 10 tablet, Rfl: 0   ondansetron  (ZOFRAN -ODT) 4 MG disintegrating tablet, Take 4 mg by mouth every 8 (eight) hours as needed., Disp: , Rfl:    oxyCODONE  (OXY IR/ROXICODONE ) 5 MG immediate release tablet, Take 1 tablet (5 mg total) by mouth every 4 (four) hours as needed for severe pain (pain score 7-10)., Disp: 15 tablet, Rfl: 0   pilocarpine  (SALAGEN ) 5 MG tablet, Take 1 tablet (5 mg total) by mouth 3 (three) times daily., Disp: 90 tablet, Rfl: 5   polyethylene glycol powder (GLYCOLAX /MIRALAX ) 17 GM/SCOOP powder, Take 17 g by mouth daily. Drink 17g (1 scoop) dissolved in water per day., Disp:  255 g, Rfl: 0   Tiotropium Bromide-Olodaterol (STIOLTO RESPIMAT ) 2.5-2.5 MCG/ACT AERS, Inhale 2 puffs into the lungs daily., Disp: 1 each, Rfl: 11

## 2024-08-14 NOTE — H&P (Signed)
 [facility] H&P  Subjective Chief Complaint: Lacey Woodard presents for a preoperative encounter.   History of Present Illness: Lacey Woodard is a 53 y.o. female who presents for preoperative visit.  She is scheduled to undergo Plan for surgery: Exam under anesthesia, midurethral sling, cystoscopy, possible anterior repair  on 08/19/24.  Her symptoms include Stress urinary incontinence and anterior vaginal prolapse, and she was was found to have Stage I anterior, Stage I posterior, Stage 0 apical prolapse.   Urodynamics showed: Deferred, CST+   Past Medical History:  Diagnosis Date   Acid reflux    Anxiety    Depression    Emphysema lung (HCC)    Hyperlipidemia    PONV (postoperative nausea and vomiting)      Past Surgical History:  Procedure Laterality Date   bilateral inguinal hernia repair     COLONOSCOPY WITH PROPOFOL  N/A 08/08/2022   Procedure: COLONOSCOPY WITH PROPOFOL ;  Surgeon: Cindie Carlin POUR, DO;  Location: [facility];  Service: Endoscopy;  Laterality: N/A;  1:00pm, asa 2   VAGINAL HYSTERECTOMY      has no known allergies.   Family History  Problem Relation Age of Onset   Colon cancer Maternal Aunt    Bladder Cancer Neg Hx    Renal cancer Neg Hx    Uterine cancer Neg Hx     Social History[1]   Review of Systems was negative for a full 10 system review except as noted in the History of Present Illness.  Current Medications[2]   Objective There were no vitals filed for this visit.   Gen: NAD CV: S1 S2 RRR Lungs: Clear to auscultation bilaterally Abd: soft, nontender   Previous Pelvic Exam showed: POP-Q   -2                                            Aa   -2                                           Ba   -8                                              C    4                                            Gh   4                                            Pb   9                                            tvl    -2.5  Ap   -2.5                                            Bp                                                        Assessment/ Plan  Assessment: The patient is a 53 y.o. year old scheduled to undergo Exam under anesthesia, midurethral sling, cystoscopy, possible anterior repair . Verbal consent was obtained for these procedures.        [1]  Social History Tobacco Use   Smoking status: Every Day    Current packs/day: 1.00    Average packs/day: 1 pack/day for 36.0 years (36.0 ttl pk-yrs)    Types: Cigarettes    Start date: 2   Smokeless tobacco: Never   Tobacco comments:    Smoking 1 ppd.  Trying to slow down.  Wants to quit after her surger. 07/28/2024 hfb RN  Vaping Use   Vaping status: Never Used  Substance Use Topics   Alcohol use: No   Drug use: No  [2] No current facility-administered medications for this encounter.  Current Outpatient Medications:    acetaminophen  (TYLENOL ) 500 MG tablet, Take 1 tablet (500 mg total) by mouth every 6 (six) hours as needed (pain)., Disp: 30 tablet, Rfl: 0   ALPRAZolam  (XANAX ) 0.5 MG tablet, Take 1 tablet (0.5 mg total) by mouth at bedtime., Disp: 30 tablet, Rfl: 1   amoxicillin -clavulanate (AUGMENTIN ) 875-125 MG tablet, Take 1 tablet by mouth 2 (two) times daily., Disp: 14 tablet, Rfl: 0   Cholecalciferol (VITAMIN D3) 25 MCG (1000 UT) CAPS, Take 1,000 Units by mouth daily., Disp: , Rfl:    escitalopram  (LEXAPRO ) 20 MG tablet, Take 1 tablet by mouth once daily, Disp: 30 tablet, Rfl: 0   estradiol  (ESTRACE ) 2 MG tablet, Take 1 tablet (2 mg total) by mouth daily., Disp: 30 tablet, Rfl: 6   fluconazole  (DIFLUCAN ) 150 MG tablet, Take 1 tablet (150 mg total) by mouth every 3 (three) days as needed., Disp: 2 tablet, Rfl: 0   fluticasone  (FLOVENT  HFA) 220 MCG/ACT inhaler, Inhale 2 puffs into the lungs 2 (two) times daily., Disp: 1 each, Rfl: 12   gabapentin  (NEURONTIN ) 300 MG capsule, TAKE 1 CAPSULE  BY MOUTH TWICE DAILY AS NEEDED, Disp: 90 capsule, Rfl: 0   ibuprofen  (ADVIL ) 600 MG tablet, Take 1 tablet (600 mg total) by mouth every 6 (six) hours as needed., Disp: 30 tablet, Rfl: 0   magnesium oxide (MAG-OX) 400 (240 Mg) MG tablet, Take 400 mg by mouth at bedtime., Disp: , Rfl:    nystatin  (MYCOSTATIN ) 100000 UNIT/ML suspension, Take 5 mLs (500,000 Units total) by mouth 4 (four) times daily., Disp: 240 mL, Rfl: 0   ondansetron  (ZOFRAN ) 4 MG tablet, Take 1 tablet (4 mg total) by mouth every 8 (eight) hours as needed for nausea or vomiting., Disp: 10 tablet, Rfl: 0   ondansetron  (ZOFRAN -ODT) 4 MG disintegrating tablet, Take 4 mg by mouth every 8 (eight) hours as needed., Disp: , Rfl:    oxyCODONE  (OXY IR/ROXICODONE ) 5 MG immediate release tablet, Take 1 tablet (5 mg total) by mouth every 4 (four) hours as needed  for severe pain (pain score 7-10)., Disp: 15 tablet, Rfl: 0   pilocarpine  (SALAGEN ) 5 MG tablet, Take 1 tablet (5 mg total) by mouth 3 (three) times daily., Disp: 90 tablet, Rfl: 5   polyethylene glycol powder (GLYCOLAX /MIRALAX ) 17 GM/SCOOP powder, Take 17 g by mouth daily. Drink 17g (1 scoop) dissolved in water per day., Disp: 255 g, Rfl: 0   Tiotropium Bromide-Olodaterol (STIOLTO RESPIMAT ) 2.5-2.5 MCG/ACT AERS, Inhale 2 puffs into the lungs daily., Disp: 1 each, Rfl: 11

## 2024-08-15 ENCOUNTER — Other Ambulatory Visit: Payer: Self-pay | Admitting: Family Medicine

## 2024-08-15 ENCOUNTER — Other Ambulatory Visit: Payer: Self-pay | Admitting: Internal Medicine

## 2024-08-15 DIAGNOSIS — F411 Generalized anxiety disorder: Secondary | ICD-10-CM

## 2024-08-19 ENCOUNTER — Ambulatory Visit (HOSPITAL_COMMUNITY)
Admission: RE | Admit: 2024-08-19 | Discharge: 2024-08-19 | Disposition: A | Discharge status: Home / Self Care | Attending: Obstetrics and Gynecology | Admitting: Obstetrics and Gynecology

## 2024-08-19 ENCOUNTER — Encounter: Admission: RE | Payer: Self-pay

## 2024-08-19 ENCOUNTER — Ambulatory Visit (HOSPITAL_COMMUNITY): Admitting: Anesthesiology

## 2024-08-19 ENCOUNTER — Encounter (HOSPITAL_COMMUNITY): Payer: Self-pay | Admitting: Obstetrics and Gynecology

## 2024-08-19 ENCOUNTER — Other Ambulatory Visit: Payer: Self-pay

## 2024-08-19 DIAGNOSIS — F17210 Nicotine dependence, cigarettes, uncomplicated: Secondary | ICD-10-CM | POA: Insufficient documentation

## 2024-08-19 DIAGNOSIS — F32A Depression, unspecified: Secondary | ICD-10-CM | POA: Diagnosis not present

## 2024-08-19 DIAGNOSIS — N393 Stress incontinence (female) (male): Secondary | ICD-10-CM | POA: Diagnosis present

## 2024-08-19 DIAGNOSIS — E039 Hypothyroidism, unspecified: Secondary | ICD-10-CM | POA: Diagnosis not present

## 2024-08-19 DIAGNOSIS — N993 Prolapse of vaginal vault after hysterectomy: Secondary | ICD-10-CM | POA: Insufficient documentation

## 2024-08-19 DIAGNOSIS — F418 Other specified anxiety disorders: Secondary | ICD-10-CM | POA: Diagnosis not present

## 2024-08-19 DIAGNOSIS — Z01818 Encounter for other preprocedural examination: Secondary | ICD-10-CM

## 2024-08-19 DIAGNOSIS — Z7951 Long term (current) use of inhaled steroids: Secondary | ICD-10-CM | POA: Diagnosis not present

## 2024-08-19 DIAGNOSIS — F419 Anxiety disorder, unspecified: Secondary | ICD-10-CM | POA: Diagnosis not present

## 2024-08-19 DIAGNOSIS — N8110 Cystocele, unspecified: Secondary | ICD-10-CM | POA: Diagnosis present

## 2024-08-19 DIAGNOSIS — J449 Chronic obstructive pulmonary disease, unspecified: Secondary | ICD-10-CM | POA: Diagnosis not present

## 2024-08-19 DIAGNOSIS — K219 Gastro-esophageal reflux disease without esophagitis: Secondary | ICD-10-CM | POA: Diagnosis not present

## 2024-08-19 HISTORY — PX: CYSTOCELE REPAIR: SHX163

## 2024-08-19 HISTORY — PX: EXAM UNDER ANESTHESIA, PELVIC: SHX7461

## 2024-08-19 HISTORY — PX: CYSTOSCOPY: SHX5120

## 2024-08-19 HISTORY — PX: BLADDER SUSPENSION: SHX72

## 2024-08-19 SURGERY — COLPORRHAPHY, ANTERIOR, FOR CYSTOCELE REPAIR
Anesthesia: General | Site: Urethra

## 2024-08-19 MED ORDER — MEPERIDINE HCL 25 MG/ML IJ SOLN: 6.2500 mg | INTRAMUSCULAR | Status: DC | PRN

## 2024-08-19 MED ORDER — MIDAZOLAM HCL (PF) 2 MG/2ML IJ SOLN
INTRAMUSCULAR | Status: DC | PRN
  Administered 2024-08-19: 2 mg via INTRAVENOUS

## 2024-08-19 MED ORDER — GABAPENTIN 300 MG PO CAPS
ORAL_CAPSULE | ORAL | Status: AC
  Filled 2024-08-19: qty 1

## 2024-08-19 MED ORDER — EPHEDRINE SULFATE-NACL 50-0.9 MG/10ML-% IV SOSY
PREFILLED_SYRINGE | INTRAVENOUS | Status: DC | PRN
  Administered 2024-08-19: 10 mg via INTRAVENOUS

## 2024-08-19 MED ORDER — ACETAMINOPHEN 500 MG PO TABS
1000.0000 mg | ORAL_TABLET | ORAL | Status: AC
  Administered 2024-08-19: 1000 mg via ORAL

## 2024-08-19 MED ORDER — CHLORHEXIDINE GLUCONATE 0.12 % MT SOLN
15.0000 mL | Freq: Once | OROMUCOSAL | Status: AC
  Administered 2024-08-19: 15 mL via OROMUCOSAL

## 2024-08-19 MED ORDER — MIDAZOLAM HCL 2 MG/2ML IJ SOLN
INTRAMUSCULAR | Status: AC
  Filled 2024-08-19: qty 2

## 2024-08-19 MED ORDER — 0.9 % SODIUM CHLORIDE (POUR BTL) OPTIME
TOPICAL | Status: DC | PRN
  Administered 2024-08-19: 1000 mL

## 2024-08-19 MED ORDER — KETOROLAC TROMETHAMINE 30 MG/ML IJ SOLN: 30.0000 mg | Freq: Once | INTRAMUSCULAR | Status: DC | PRN

## 2024-08-19 MED ORDER — CEFAZOLIN SODIUM-DEXTROSE 2-4 GM/100ML-% IV SOLN
INTRAVENOUS | Status: AC
  Filled 2024-08-19: qty 100

## 2024-08-19 MED ORDER — ORAL CARE MOUTH RINSE: 15.0000 mL | Freq: Once | OROMUCOSAL | Status: AC

## 2024-08-19 MED ORDER — PROPOFOL 10 MG/ML IV BOLUS
INTRAVENOUS | Status: AC
  Filled 2024-08-19: qty 20

## 2024-08-19 MED ORDER — OXYCODONE HCL 5 MG PO TABS: 5.0000 mg | ORAL_TABLET | Freq: Once | ORAL | Status: DC | PRN

## 2024-08-19 MED ORDER — PHENAZOPYRIDINE HCL 100 MG PO TABS
200.0000 mg | ORAL_TABLET | ORAL | Status: AC
  Administered 2024-08-19: 200 mg via ORAL

## 2024-08-19 MED ORDER — CHLORHEXIDINE GLUCONATE 0.12 % MT SOLN
OROMUCOSAL | Status: AC
  Filled 2024-08-19: qty 15

## 2024-08-19 MED ORDER — AMISULPRIDE (ANTIEMETIC) 5 MG/2ML IV SOLN: 10.0000 mg | Freq: Once | INTRAVENOUS | Status: DC | PRN

## 2024-08-19 MED ORDER — ONDANSETRON HCL 4 MG/2ML IJ SOLN: 4.0000 mg | Freq: Once | INTRAMUSCULAR | Status: DC | PRN

## 2024-08-19 MED ORDER — KETOROLAC TROMETHAMINE 30 MG/ML IJ SOLN
INTRAMUSCULAR | Status: DC | PRN
  Administered 2024-08-19: 30 mg via INTRAVENOUS

## 2024-08-19 MED ORDER — LIDOCAINE-EPINEPHRINE 1 %-1:100000 IJ SOLN
INTRAMUSCULAR | Status: DC | PRN
  Administered 2024-08-19: 14 mL

## 2024-08-19 MED ORDER — LACTATED RINGERS IV SOLN: INTRAVENOUS | Status: DC

## 2024-08-19 MED ORDER — GABAPENTIN 300 MG PO CAPS
300.0000 mg | ORAL_CAPSULE | ORAL | Status: AC
  Administered 2024-08-19: 300 mg via ORAL

## 2024-08-19 MED ORDER — IPRATROPIUM-ALBUTEROL 0.5-2.5 (3) MG/3ML IN SOLN
RESPIRATORY_TRACT | Status: AC
  Administered 2024-08-19: 3 mL via RESPIRATORY_TRACT
  Filled 2024-08-19: qty 3

## 2024-08-19 MED ORDER — IPRATROPIUM-ALBUTEROL 0.5-2.5 (3) MG/3ML IN SOLN
3.0000 mL | Freq: Once | RESPIRATORY_TRACT | Status: AC
  Administered 2024-08-19: 3 mL via RESPIRATORY_TRACT
  Filled 2024-08-19: qty 3

## 2024-08-19 MED ORDER — KETAMINE HCL 10 MG/ML IJ SOLN
INTRAMUSCULAR | Status: DC | PRN
  Administered 2024-08-19: 30 mg via INTRAVENOUS

## 2024-08-19 MED ORDER — IPRATROPIUM-ALBUTEROL 0.5-2.5 (3) MG/3ML IN SOLN: 3.0000 mL | Freq: Once | RESPIRATORY_TRACT | Status: AC

## 2024-08-19 MED ORDER — PHENAZOPYRIDINE HCL 100 MG PO TABS
ORAL_TABLET | ORAL | Status: AC
  Filled 2024-08-19: qty 2

## 2024-08-19 MED ORDER — HYDROMORPHONE HCL 1 MG/ML IJ SOLN: 0.2500 mg | INTRAMUSCULAR | Status: DC | PRN

## 2024-08-19 MED ORDER — PHENYLEPHRINE HCL-NACL 20-0.9 MG/250ML-% IV SOLN
INTRAVENOUS | Status: DC | PRN
  Administered 2024-08-19: 25 ug/min via INTRAVENOUS
  Administered 2024-08-19: 50 ug/min via INTRAVENOUS

## 2024-08-19 MED ORDER — PROPOFOL 10 MG/ML IV BOLUS
INTRAVENOUS | Status: DC | PRN
  Administered 2024-08-19: 150 mg via INTRAVENOUS

## 2024-08-19 MED ORDER — PROPOFOL 1000 MG/100ML IV EMUL
INTRAVENOUS | Status: AC
  Filled 2024-08-19: qty 200

## 2024-08-19 MED ORDER — OXYCODONE HCL 5 MG/5ML PO SOLN: 5.0000 mg | Freq: Once | ORAL | Status: DC | PRN

## 2024-08-19 MED ORDER — PROPOFOL 500 MG/50ML IV EMUL
INTRAVENOUS | Status: DC | PRN
  Administered 2024-08-19: 175 ug/kg/min via INTRAVENOUS

## 2024-08-19 MED ORDER — ONDANSETRON HCL 4 MG/2ML IJ SOLN
INTRAMUSCULAR | Status: DC | PRN
  Administered 2024-08-19: 4 mg via INTRAVENOUS

## 2024-08-19 MED ORDER — FENTANYL CITRATE (PF) 100 MCG/2ML IJ SOLN
INTRAMUSCULAR | Status: AC
  Filled 2024-08-19: qty 2

## 2024-08-19 MED ORDER — DEXAMETHASONE SOD PHOSPHATE PF 10 MG/ML IJ SOLN
INTRAMUSCULAR | Status: DC | PRN
  Administered 2024-08-19: 10 mg via INTRAVENOUS

## 2024-08-19 MED ORDER — CEFAZOLIN SODIUM-DEXTROSE 2-4 GM/100ML-% IV SOLN
2.0000 g | INTRAVENOUS | Status: AC
  Administered 2024-08-19: 2 g via INTRAVENOUS

## 2024-08-19 MED ORDER — ACETAMINOPHEN 500 MG PO TABS
ORAL_TABLET | ORAL | Status: AC
  Filled 2024-08-19: qty 2

## 2024-08-19 MED ORDER — PHENYLEPHRINE 80 MCG/ML (10ML) SYRINGE FOR IV PUSH (FOR BLOOD PRESSURE SUPPORT)
PREFILLED_SYRINGE | INTRAVENOUS | Status: DC | PRN
  Administered 2024-08-19 (×2): 160 ug via INTRAVENOUS
  Administered 2024-08-19: 80 ug via INTRAVENOUS
  Administered 2024-08-19: 160 ug via INTRAVENOUS
  Administered 2024-08-19: 240 ug via INTRAVENOUS

## 2024-08-19 MED ORDER — FENTANYL CITRATE (PF) 100 MCG/2ML IJ SOLN
INTRAMUSCULAR | Status: DC | PRN
  Administered 2024-08-19: 50 ug via INTRAVENOUS

## 2024-08-19 MED ORDER — IPRATROPIUM-ALBUTEROL 0.5-2.5 (3) MG/3ML IN SOLN
RESPIRATORY_TRACT | Status: AC
  Filled 2024-08-19: qty 3

## 2024-08-19 MED ORDER — PHENYLEPHRINE HCL-NACL 20-0.9 MG/250ML-% IV SOLN: INTRAVENOUS | Status: DC | PRN

## 2024-08-19 MED ORDER — LIDOCAINE 2% (20 MG/ML) 5 ML SYRINGE
INTRAMUSCULAR | Status: DC | PRN
  Administered 2024-08-19: 60 mg via INTRAVENOUS

## 2024-08-19 MED ORDER — SODIUM CHLORIDE 0.9 % IR SOLN
Status: DC | PRN
  Administered 2024-08-19: 1000 mL via INTRAVESICAL

## 2024-08-19 MED ORDER — KETAMINE HCL 50 MG/5ML IJ SOSY
PREFILLED_SYRINGE | INTRAMUSCULAR | Status: AC
  Filled 2024-08-19: qty 5

## 2024-08-19 SURGICAL SUPPLY — 34 items
BLADE SURG 15 STRL LF DISP TIS (BLADE) ×4 IMPLANT
DERMABOND ADVANCED .7 DNX12 (GAUZE/BANDAGES/DRESSINGS) IMPLANT
GLOVE BIOGEL PI IND STRL 6.5 (GLOVE) ×4 IMPLANT
GLOVE BIOGEL PI IND STRL 7.0 (GLOVE) ×4 IMPLANT
GLOVE BIOGEL PI IND STRL 7.5 (GLOVE) IMPLANT
GLOVE BIOGEL PI MICRO STRL 6 (GLOVE) ×4 IMPLANT
GLOVE SURG SS PI 7.0 STRL IVOR (GLOVE) IMPLANT
GLOVE SURG SS PI 7.5 STRL IVOR (GLOVE) IMPLANT
GOWN STRL REUS W/ TWL LRG LVL3 (GOWN DISPOSABLE) ×4 IMPLANT
GOWN STRL REUS W/TWL LRG LVL3 (GOWN DISPOSABLE) ×4 IMPLANT
GOWN STRL SURGICAL XL XLNG (GOWN DISPOSABLE) IMPLANT
HIBICLENS CHG 4% 4OZ BTL (MISCELLANEOUS) ×4 IMPLANT
HOLDER FOLEY CATH W/STRAP (MISCELLANEOUS) ×4 IMPLANT
IV 0.9% NACL 1000 ML (IV SOLUTION) IMPLANT
KIT TURNOVER KIT B (KITS) ×4 IMPLANT
MANIFOLD NEPTUNE II (INSTRUMENTS) ×4 IMPLANT
NDL HYPO 22X1.5 SAFETY MO (MISCELLANEOUS) ×4 IMPLANT
NEEDLE HYPO 22X1.5 SAFETY MO (MISCELLANEOUS) ×4 IMPLANT
PACK CYSTO (CUSTOM PROCEDURE TRAY) ×4 IMPLANT
PACK VAGINAL WOMENS (CUSTOM PROCEDURE TRAY) ×4 IMPLANT
PAD OB MATERNITY 11 LF (PERSONAL CARE ITEMS) ×4 IMPLANT
RETRACTOR LONE STAR DISPOSABLE (INSTRUMENTS) ×4 IMPLANT
RETRACTOR STAY HOOK 5MM (MISCELLANEOUS) ×4 IMPLANT
SET CYSTO IRRIGATION (SET/KITS/TRAYS/PACK) ×4 IMPLANT
SET IRRIG Y-TYPE CYSTO (SET/KITS/TRAYS/PACK) ×4 IMPLANT
SLEEVE SCD COMPRESS KNEE MED (STOCKING) ×4 IMPLANT
SLING ADVANTAGE FIT TRANVAG (Sling) IMPLANT
SOLN 0.9% NACL POUR BTL 1000ML (IV SOLUTION) ×4 IMPLANT
SPIKE FLUID TRANSFER (MISCELLANEOUS) IMPLANT
SUT VIC AB 2-0 SH 27XBRD (SUTURE) IMPLANT
SUT VICRYL 2-0 SH 8X27 (SUTURE) ×4 IMPLANT
SYR BULB EAR ULCER 3OZ GRN STR (SYRINGE) ×4 IMPLANT
TOWEL GREEN STERILE FF (TOWEL DISPOSABLE) ×4 IMPLANT
TRAY FOLEY W/BAG SLVR 14FR LF (SET/KITS/TRAYS/PACK) ×4 IMPLANT

## 2024-08-19 NOTE — Anesthesia Postprocedure Evaluation (Signed)
"   Anesthesia Post Note  Patient: Lacey Woodard  Procedure(s) Performed: COLPORRHAPHY, ANTERIOR, FOR CYSTOCELE REPAIR (Vagina ) CYSTOSCOPY (Bladder) CREATION, URETHRAL SLING, RETROPUBIC APPROACH, USING POLYPROPYLENE TAPE (Urethra) EXAM UNDER ANESTHESIA, PELVIC (Pelvis)     Patient location during evaluation: PACU Anesthesia Type: General Level of consciousness: awake and alert, oriented and patient cooperative Pain management: pain level controlled Vital Signs Assessment: post-procedure vital signs reviewed and stable Respiratory status: spontaneous breathing, nonlabored ventilation and respiratory function stable Cardiovascular status: blood pressure returned to baseline and stable Postop Assessment: no apparent nausea or vomiting Anesthetic complications: no   There were no known notable events for this encounter.  Last Vitals:  Vitals:   08/19/24 1300 08/19/24 1305  BP: 117/83   Pulse: 81 87  Resp: 14 14  Temp:    SpO2: 100% 100%    Last Pain:  Vitals:   08/19/24 1242  TempSrc:   PainSc: 2                  [name]      "

## 2024-08-19 NOTE — Interval H&P Note (Signed)
 History and Physical Interval Note:  08/19/2024 10:38 AM  Lacey Woodard  has presented today for surgery, with the diagnosis of Stress urinary incontinence Prolapse of anterior vaginal wall.  The various methods of treatment have been discussed with the patient and family. After consideration of risks, benefits and other options for treatment, the patient has consented to  Procedures with comments: COLPORRHAPHY, ANTERIOR, FOR CYSTOCELE REPAIR- possible (N/A) - possible anterior repair CYSTOSCOPY (N/A) CREATION, URETHRAL SLING, RETROPUBIC APPROACH, USING POLYPROPYLENE TAPE EXAM UNDER ANESTHESIA, PELVIC as a surgical intervention.  The patient's history has been reviewed, patient examined, no change in status, stable for surgery.  I have reviewed the patient's chart and labs.  Questions were answered to the patient's satisfaction.     [name]

## 2024-08-19 NOTE — Transfer of Care (Signed)
 Immediate Anesthesia Transfer of Care Note  Patient: Lacey Woodard  Procedure(s) Performed: COLPORRHAPHY, ANTERIOR, FOR CYSTOCELE REPAIR (Vagina ) CYSTOSCOPY (Bladder) CREATION, URETHRAL SLING, RETROPUBIC APPROACH, USING POLYPROPYLENE TAPE (Urethra) EXAM UNDER ANESTHESIA, PELVIC (Pelvis)  Patient Location: PACU  Anesthesia Type:General  Level of Consciousness: awake  Airway & Oxygen Therapy: Patient Spontanous Breathing and Patient connected to face mask oxygen  Post-op Assessment: Report given to RN  Post vital signs: stable  Last Vitals:  Vitals Value Taken Time  BP 96/53 08/19/24 12:32  Temp    Pulse 85 08/19/24 12:32  Resp 15 08/19/24 12:32  SpO2 100 % 08/19/24 12:32  Vitals shown include unfiled device data.  Last Pain:  Vitals:   08/19/24 1002  TempSrc: Oral  PainSc: 0-No pain      Patients Stated Pain Goal: 4 (08/19/24 1002)  Complications: There were no known notable events for this encounter.

## 2024-08-19 NOTE — Anesthesia Preprocedure Evaluation (Addendum)
"                                    Anesthesia Evaluation  Patient identified by MRN, date of birth, ID band Patient awake    Reviewed: Allergy & Precautions, H&P , NPO status , Patient's Chart, lab work & pertinent test results  History of Anesthesia Complications (+) PONV and history of anesthetic complications  Airway Mallampati: III  TM Distance: >3 FB Neck ROM: Full    Dental  (+) Edentulous Upper, Edentulous Lower   Pulmonary COPD,  COPD inhaler, Current SmokerPatient did not abstain from smoking. CT scan for lung cancer screening 07/2023 on my read interpretation reveals significant, severe emphysematous changes with a large bleb at the right apex versus possible chronic loculated pneumothorax.  Still smoking 1ppd, 36 pack year history, last cigarette 2am  Albuterol  once per day   Snores at night, no sleep study    Pulmonary exam normal breath sounds clear to auscultation       Cardiovascular + DOE  Normal cardiovascular exam Rhythm:Regular Rate:Normal     Neuro/Psych  PSYCHIATRIC DISORDERS Anxiety Depression    negative neurological ROS     GI/Hepatic Neg liver ROS,GERD  Controlled,,  Endo/Other  Hypothyroidism    Renal/GU negative Renal ROS  negative genitourinary   Musculoskeletal negative musculoskeletal ROS (+)    Abdominal   Peds negative pediatric ROS (+)  Hematology negative hematology ROS (+)   Anesthesia Other Findings   Reproductive/Obstetrics negative OB ROS                              Anesthesia Physical Anesthesia Plan  ASA: 3  Anesthesia Plan: General   Post-op Pain Management: Tylenol  PO (pre-op)*, Toradol  IV (intra-op)*, Ketamine  IV* and Dilaudid  IV   Induction: Intravenous  PONV Risk Score and Plan: 3 and Ondansetron , Dexamethasone , Treatment may vary due to age or medical condition and Midazolam   Airway Management Planned: Oral ETT  Additional Equipment: None  Intra-op Plan:    Post-operative Plan: Extubation in OR  Informed Consent: I have reviewed the patients History and Physical, chart, labs and discussed the procedure including the risks, benefits and alternatives for the proposed anesthesia with the patient or authorized representative who has indicated his/her understanding and acceptance.     Dental advisory given  Plan Discussed with: CRNA  Anesthesia Plan Comments:          Anesthesia Quick Evaluation  "

## 2024-08-19 NOTE — Op Note (Signed)
 Operative Note  Preoperative Diagnosis: anterior vaginal prolapse and stress urinary incontinence  Postoperative Diagnosis: same  Procedures performed:  Anterior repair, midurethral sling (Advantage Fit), cystoscopy  Implants:  Implant Name Type Inv. Item Serial No. Manufacturer Lot No. LRB No. Used Action  VALORIE CAROLENE BLUSH [facility] - [id number] Sling SLING ADVANTAGE FIT [employer] [id number]  1 Implanted    Attending Surgeon: Rosaline Caper, MD  Anesthesia: General LMA  Findings: 1. On vaginal exam, stage II anterior prolapse present  2. On cystoscopy, normal bladder and urethral mucosa without injury or lesion. Brisk bilateral ureteral efflux present.    Specimens: none  Estimated blood loss: 25 mL  IV fluids: 500 mL  Urine output: 50 mL  Complications: none  Procedure in Detail:  After informed consent was obtained, the patient was taken to the operating room where she was placed under anesthesia.  She was then placed in the dorsal lithotomy position in [place] stirrups, taking care to avoid traction on her extremities. She was then and prepped and draped in the usual sterile fashion.   A foley catheter was placed.  A lonestar retractor was placed with 4 stay hooks. Two Allis clamps were placed at along the anterior vaginal wall defect. 1% lidocaine  with epinephrine  was injected into the vaginal mucosa. A vertical incision was made between the two Allis clamps with a 15 blade scalpel.  Metzenbaum scissors were used to undermine the vaginal mucosa from the underlying vesicovaginal septum. Plication of the vesicovaginal septum was then performed using 2-0 Vicryl. Excess vaginal epithelium was trimmed and the incision reapproximated with 2-0 Vicryl in a running fashion.    The retropubic midurethral sling was performed next. Two Allis clamps were placed lateral to the location of the midurethra. 1% lidocaine  with epinephrine  was injected into the vaginal  mucosa and laterally.  Using a 15 blade scapel a vertical incision was made in the vaginal mucosa. Metzenbaum scissors were used to dissect the vaginal mucosa off of the underlying muscularis and to create the periurethral tunnels. The trocar and attached sling were introduced into the right side of the vaginal incision, just inferior to the pubic symphysis on the right side. The trocar was guided through the endopelvic fascia and directly behind the pubic symphysis through the retropubic space, vertically.  The trocar was guided out through the suprapubic region, 2 fingerbreadths lateral to midline at the level of the pubic symphysis on the ipsilateral side. The trocar was similarly placed on the left side.  The Foley catheter was removed.  A 70-degree cystoscope was introduced, and 360-degree inspection revealed no trauma or trocars in the bladder, with bilateral ureteral efflux.  The bladder was drained and the cystoscope was removed.  The Foley catheter was reinserted. The sling was brought to lie beneath the mid-urethra.  An empty needle driver was placed behind the sling to ensure a tension free placement. The plastic sheath was removed from the sling and the distal ends of the sling were trimmed just below the level of the skin incisions. The skin incisions were closed with dermabond.  Hemostasis was noted.  Tension-free positioning of the sling was confirmed.  The vaginal incision was closed with 2-0 vicryl.  The vagina was copiously irrigated. Mild bleeding was noted from the anterior repair incision. A 2-0 vicryl figure of eight suture was placed. Hemostasis was noted. The patient tolerated the procedure well and was taken to the recovery room in stable condition. Needle and sponge count was  correct x2.   [name], MD

## 2024-08-19 NOTE — Discharge Instructions (Addendum)

## 2024-08-20 ENCOUNTER — Encounter (HOSPITAL_COMMUNITY): Payer: Self-pay | Admitting: Obstetrics and Gynecology

## 2024-08-25 ENCOUNTER — Telehealth: Admitting: Family Medicine

## 2024-08-25 ENCOUNTER — Other Ambulatory Visit: Payer: Self-pay | Admitting: Family Medicine

## 2024-08-25 DIAGNOSIS — F331 Major depressive disorder, recurrent, moderate: Secondary | ICD-10-CM

## 2024-08-25 MED ORDER — BUPROPION HCL ER (XL) 150 MG PO TB24: 150.0000 mg | ORAL_TABLET | Freq: Every day | ORAL | 1 refills | Status: DC

## 2024-08-25 NOTE — Progress Notes (Signed)
 "  Virtual Visit via Video Note  I connected with Lacey Woodard on 08/25/2024 at  9:40 AM EST by a video enabled telemedicine application and verified that I am speaking with the correct person using two identifiers.  Patient Location: Home Provider Location: Home Office  I discussed the limitations, risks, security, and privacy concerns of performing an evaluation and management service by video and the availability of in person appointments. I also discussed with the patient that there may be a patient responsible charge related to this service. The patient expressed understanding and agreed to proceed.  Subjective: PCP: Terry Wilhelmena Lloyd Hilario, FNP  Chief Complaint  Patient presents with   Medical Management of Chronic Issues    Follow up    HPI The patient reports taking Vraylar  in combination with Lexapro  and subsequently experiencing generalized body aches. She previously took Abilify  for depression and also experienced body aches while on that medication. She denies suicidal ideation, homicidal ideation, and auditory or visual hallucinations.   ROS: Per HPI Current Medications[1]  Observations/Objective: There were no vitals filed for this visit. Physical Exam Patient is well-developed, well-nourished in no acute distress.  Resting comfortably at home.  Head is normocephalic, atraumatic.  No labored breathing.  Speech is clear and coherent with logical content.  Patient is alert and oriented at baseline.   Assessment and Plan: Moderate episode of recurrent major depressive disorder (HCC) -     buPROPion HCl ER (XL); Take 1 tablet (150 mg total) by mouth daily.  Dispense: 90 tablet; Refill: 1   The patient was encouraged to continue taking Lexapro  20 mg daily. Augmentation therapy will be initiated with Wellbutrin XL 150 mg daily. If there is treatment failure with Wellbutrin, [employer] pharmacogenomic testing will be considered. The patient was encouraged to  monitor for potential side effects while on the treatment regimen, including increased anxiety, insomnia, headache, gastrointestinal upset, dry mouth, or changes in mood, and to report any concerning symptoms promptly.  Follow Up Instructions: Return in about 6 weeks (around 10/06/2024).   I discussed the assessment and treatment plan with the patient. The patient was provided an opportunity to ask questions, and all were answered. The patient agreed with the plan and demonstrated an understanding of the instructions.   The patient was advised to call back or seek an in-person evaluation if the symptoms worsen or if the condition fails to improve as anticipated.  The above assessment and management plan was discussed with the patient. The patient verbalized understanding of and has agreed to the management plan.   Meade JENEANE Gerlach, FNP     [1]  Current Outpatient Medications:    buPROPion (WELLBUTRIN XL) 150 MG 24 hr tablet, Take 1 tablet (150 mg total) by mouth daily., Disp: 90 tablet, Rfl: 1   acetaminophen  (TYLENOL ) 500 MG tablet, Take 1 tablet (500 mg total) by mouth every 6 (six) hours as needed (pain). (Patient taking differently: Take 500 mg by mouth every 6 (six) hours as needed (pain). Post-op), Disp: 30 tablet, Rfl: 0   ALPRAZolam  (XANAX ) 0.5 MG tablet, TAKE 1 TABLET BY MOUTH AT BEDTIME, Disp: 30 tablet, Rfl: 0   amoxicillin -clavulanate (AUGMENTIN ) 875-125 MG tablet, Take 1 tablet by mouth 2 (two) times daily. (Patient not taking: Reported on 08/19/2024), Disp: 14 tablet, Rfl: 0   Cholecalciferol (VITAMIN D3) 25 MCG (1000 UT) CAPS, Take 1,000 Units by mouth daily., Disp: , Rfl:    escitalopram  (LEXAPRO ) 20 MG tablet, Take 1 tablet  by mouth once daily, Disp: 30 tablet, Rfl: 0   estradiol  (ESTRACE ) 2 MG tablet, Take 1 tablet (2 mg total) by mouth daily., Disp: 30 tablet, Rfl: 6   fluconazole  (DIFLUCAN ) 150 MG tablet, Take 1 tablet (150 mg total) by mouth every 3 (three) days as  needed., Disp: 2 tablet, Rfl: 0   fluticasone  (FLOVENT  HFA) 220 MCG/ACT inhaler, Inhale 2 puffs into the lungs 2 (two) times daily., Disp: 1 each, Rfl: 12   gabapentin  (NEURONTIN ) 300 MG capsule, TAKE 1 CAPSULE BY MOUTH TWICE DAILY AS NEEDED, Disp: 90 capsule, Rfl: 0   ibuprofen  (ADVIL ) 600 MG tablet, Take 1 tablet (600 mg total) by mouth every 6 (six) hours as needed. (Patient taking differently: Take 600 mg by mouth every 6 (six) hours as needed. Post-op), Disp: 30 tablet, Rfl: 0   magnesium oxide (MAG-OX) 400 (240 Mg) MG tablet, Take 400 mg by mouth at bedtime., Disp: , Rfl:    nystatin  (MYCOSTATIN ) 100000 UNIT/ML suspension, Take 5 mLs (500,000 Units total) by mouth 4 (four) times daily., Disp: 240 mL, Rfl: 0   ondansetron  (ZOFRAN ) 4 MG tablet, Take 1 tablet (4 mg total) by mouth every 8 (eight) hours as needed for nausea or vomiting. (Patient taking differently: Take 4 mg by mouth every 8 (eight) hours as needed for nausea or vomiting. Post-op), Disp: 10 tablet, Rfl: 0   ondansetron  (ZOFRAN -ODT) 4 MG disintegrating tablet, Take 4 mg by mouth every 8 (eight) hours as needed., Disp: , Rfl:    oxyCODONE  (OXY IR/ROXICODONE ) 5 MG immediate release tablet, Take 1 tablet (5 mg total) by mouth every 4 (four) hours as needed for severe pain (pain score 7-10). (Patient taking differently: Take 5 mg by mouth every 4 (four) hours as needed for severe pain (pain score 7-10). Post-op), Disp: 15 tablet, Rfl: 0   pilocarpine  (SALAGEN ) 5 MG tablet, Take 1 tablet (5 mg total) by mouth 3 (three) times daily., Disp: 90 tablet, Rfl: 5   polyethylene glycol powder (GLYCOLAX /MIRALAX ) 17 GM/SCOOP powder, Take 17 g by mouth daily. Drink 17g (1 scoop) dissolved in water per day. (Patient taking differently: Take 17 g by mouth daily. Drink 17g (1 scoop) dissolved in water per day. Post-op), Disp: 255 g, Rfl: 0   Tiotropium Bromide-Olodaterol (STIOLTO RESPIMAT ) 2.5-2.5 MCG/ACT AERS, Inhale 2 puffs into the lungs daily., Disp: 1  each, Rfl: 11  "

## 2024-09-11 ENCOUNTER — Other Ambulatory Visit: Payer: Self-pay | Admitting: Family Medicine

## 2024-09-29 ENCOUNTER — Encounter: Payer: Self-pay | Admitting: Obstetrics and Gynecology

## 2024-09-30 ENCOUNTER — Encounter: Admitting: Obstetrics and Gynecology

## 2024-10-03 ENCOUNTER — Encounter: Payer: Self-pay | Admitting: Obstetrics and Gynecology

## 2024-10-03 ENCOUNTER — Other Ambulatory Visit: Payer: Self-pay | Admitting: Family Medicine

## 2024-10-03 ENCOUNTER — Ambulatory Visit: Admitting: Obstetrics and Gynecology

## 2024-10-03 VITALS — BP 120/79 | HR 94

## 2024-10-03 DIAGNOSIS — Z9889 Other specified postprocedural states: Secondary | ICD-10-CM

## 2024-10-03 DIAGNOSIS — F411 Generalized anxiety disorder: Secondary | ICD-10-CM

## 2024-10-03 NOTE — Progress Notes (Signed)
 [facility]  Date of Visit: 10/03/2024  History of Present Illness: Lacey Woodard is a 54 y.o. female scheduled today for a post-operative visit.   Surgery: s/p Anterior repair, midurethral sling (Advantage Fit), cystoscopy on 08/19/24  She passed her postoperative void trial.   Postoperative course has been uncomplicated.   Today she reports she has a little tenderness in her lower abdomen occasionally but not sure it is related to the surgery. She denies dysuria symptoms. She has been dry and is happy she no longer needs to wear a pad.   UTI in the last 6 weeks? No  Pain? No She has not returned to her normal activity (except for postop restrictions) Vaginal bulge? No  Stress incontinence: No  Urgency/frequency: No  Urge incontinence: No  Voiding dysfunction: No  Bowel issues: No   Subjective Success: Do you usually have a bulge or something falling out that you can see or feel in the vaginal area? No  Retreatment Success: Any retreatment with surgery or pessary for any compartment? No    Medications: She has a current medication list which includes the following prescription(s): alprazolam , vitamin d3, escitalopram , estradiol , fluconazole , fluticasone , gabapentin , ibuprofen , magnesium oxide, nystatin , ondansetron , pilocarpine , and stiolto respimat .   Allergies: Patient has no known allergies.   Physical Exam: BP 120/79   Pulse 94   Abdomen: soft, non-tender, without masses or organomegaly Suprapubic Incisions: healing well.  Pelvic Examination: Vagina: Incisions healing well. Sutures are present at incision line and there is not granulation tissue. No tenderness along the anterior or posterior vagina. No apical tenderness. No pelvic masses. No visible or palpable mesh.  POP-Q: POP-Q  -3                                            Aa   -3                                           Ba  -7.5                                              C   2.5                                             Gh  4.5                                            Pb  9                                            tvl   -3  Ap  -3                                            Bp  -9                                              D    ---------------------------------------------------------  Assessment and Plan:  1. Post-operative state    - well healed - Can resume regular activity including exercise and intercourse. Discussed avoidance of heavy lifting and straining long term to reduce the risk of recurrence. Advised to wait 2 additional weeks for intercourse.   Return as needed  [name], MD

## 2024-10-03 NOTE — Patient Instructions (Signed)
OK to return to all activity.

## 2024-10-27 ENCOUNTER — Ambulatory Visit: Admitting: Pulmonary Disease
